# Patient Record
Sex: Male | Born: 1950
Health system: Southern US, Community
[De-identification: ages and names within clinical notes are randomized; demographics above are authoritative.]

## PROBLEM LIST (undated history)

## (undated) DIAGNOSIS — M51369 Other intervertebral disc degeneration, lumbar region without mention of lumbar back pain or lower extremity pain: Secondary | ICD-10-CM

## (undated) DIAGNOSIS — N529 Male erectile dysfunction, unspecified: Secondary | ICD-10-CM

## (undated) DIAGNOSIS — E785 Hyperlipidemia, unspecified: Secondary | ICD-10-CM

## (undated) DIAGNOSIS — I10 Essential (primary) hypertension: Secondary | ICD-10-CM

## (undated) DIAGNOSIS — M5136 Other intervertebral disc degeneration, lumbar region: Secondary | ICD-10-CM

## (undated) DIAGNOSIS — M5126 Other intervertebral disc displacement, lumbar region: Secondary | ICD-10-CM

## (undated) DIAGNOSIS — E291 Testicular hypofunction: Secondary | ICD-10-CM

## (undated) DIAGNOSIS — K429 Umbilical hernia without obstruction or gangrene: Secondary | ICD-10-CM

## (undated) DIAGNOSIS — E119 Type 2 diabetes mellitus without complications: Secondary | ICD-10-CM

## (undated) HISTORY — PX: APPENDECTOMY: SHX54

---

## 2008-01-18 ENCOUNTER — Emergency Department (HOSPITAL_COMMUNITY): Admission: EM | Admit: 2008-01-18 | Discharge: 2008-01-18 | Payer: Self-pay | Admitting: Emergency Medicine

## 2011-05-12 LAB — MONONUCLEOSIS SCREEN: Mono Screen: NEGATIVE

## 2011-05-12 LAB — POCT I-STAT, CHEM 8
BUN: 14
Creatinine, Ser: 1.1
Glucose, Bld: 144 — ABNORMAL HIGH
Hemoglobin: 16.3
TCO2: 23

## 2011-05-12 LAB — CBC
HCT: 47.1
Hemoglobin: 16.3
MCHC: 34.7
MCV: 87.7
RBC: 5.37

## 2011-05-12 LAB — DIFFERENTIAL
Basophils Absolute: 0
Basophils Relative: 0
Eosinophils Absolute: 0
Eosinophils Relative: 0
Lymphocytes Relative: 5 — ABNORMAL LOW
Lymphs Abs: 0.3 — ABNORMAL LOW
Monocytes Absolute: 0.5
Monocytes Relative: 10
Neutro Abs: 4.3
Neutrophils Relative %: 85 — ABNORMAL HIGH

## 2011-05-12 LAB — INFLUENZA A+B VIRUS AG-DIRECT(RAPID)
Inflenza A Ag: NEGATIVE
Influenza B Ag: NEGATIVE

## 2011-05-12 LAB — URINALYSIS, ROUTINE W REFLEX MICROSCOPIC
Bilirubin Urine: NEGATIVE
Glucose, UA: 500 — AB
Hgb urine dipstick: NEGATIVE
Nitrite: NEGATIVE
Protein, ur: NEGATIVE
Specific Gravity, Urine: 1.04 — ABNORMAL HIGH
Urobilinogen, UA: 1
pH: 5.5

## 2016-08-19 DIAGNOSIS — E559 Vitamin D deficiency, unspecified: Secondary | ICD-10-CM | POA: Diagnosis not present

## 2016-08-19 DIAGNOSIS — Z125 Encounter for screening for malignant neoplasm of prostate: Secondary | ICD-10-CM | POA: Diagnosis not present

## 2016-08-19 DIAGNOSIS — E1122 Type 2 diabetes mellitus with diabetic chronic kidney disease: Secondary | ICD-10-CM | POA: Diagnosis not present

## 2016-08-19 DIAGNOSIS — Z0001 Encounter for general adult medical examination with abnormal findings: Secondary | ICD-10-CM | POA: Diagnosis not present

## 2016-08-19 DIAGNOSIS — I129 Hypertensive chronic kidney disease with stage 1 through stage 4 chronic kidney disease, or unspecified chronic kidney disease: Secondary | ICD-10-CM | POA: Diagnosis not present

## 2016-08-29 DIAGNOSIS — Z23 Encounter for immunization: Secondary | ICD-10-CM | POA: Diagnosis not present

## 2016-08-29 DIAGNOSIS — Z0001 Encounter for general adult medical examination with abnormal findings: Secondary | ICD-10-CM | POA: Diagnosis not present

## 2016-12-22 DIAGNOSIS — E119 Type 2 diabetes mellitus without complications: Secondary | ICD-10-CM | POA: Diagnosis not present

## 2016-12-22 DIAGNOSIS — R05 Cough: Secondary | ICD-10-CM | POA: Diagnosis not present

## 2016-12-22 DIAGNOSIS — J309 Allergic rhinitis, unspecified: Secondary | ICD-10-CM | POA: Diagnosis not present

## 2017-02-21 DIAGNOSIS — I129 Hypertensive chronic kidney disease with stage 1 through stage 4 chronic kidney disease, or unspecified chronic kidney disease: Secondary | ICD-10-CM | POA: Diagnosis not present

## 2017-02-21 DIAGNOSIS — N182 Chronic kidney disease, stage 2 (mild): Secondary | ICD-10-CM | POA: Diagnosis not present

## 2017-02-21 DIAGNOSIS — E559 Vitamin D deficiency, unspecified: Secondary | ICD-10-CM | POA: Diagnosis not present

## 2017-02-21 DIAGNOSIS — E785 Hyperlipidemia, unspecified: Secondary | ICD-10-CM | POA: Diagnosis not present

## 2017-02-21 DIAGNOSIS — E1121 Type 2 diabetes mellitus with diabetic nephropathy: Secondary | ICD-10-CM | POA: Diagnosis not present

## 2017-02-27 DIAGNOSIS — E785 Hyperlipidemia, unspecified: Secondary | ICD-10-CM | POA: Diagnosis not present

## 2017-02-27 DIAGNOSIS — E1122 Type 2 diabetes mellitus with diabetic chronic kidney disease: Secondary | ICD-10-CM | POA: Diagnosis not present

## 2017-02-27 DIAGNOSIS — I129 Hypertensive chronic kidney disease with stage 1 through stage 4 chronic kidney disease, or unspecified chronic kidney disease: Secondary | ICD-10-CM | POA: Diagnosis not present

## 2017-02-27 DIAGNOSIS — E559 Vitamin D deficiency, unspecified: Secondary | ICD-10-CM | POA: Diagnosis not present

## 2017-02-27 DIAGNOSIS — E875 Hyperkalemia: Secondary | ICD-10-CM | POA: Diagnosis not present

## 2017-03-07 DIAGNOSIS — E119 Type 2 diabetes mellitus without complications: Secondary | ICD-10-CM | POA: Diagnosis not present

## 2017-03-07 DIAGNOSIS — H6123 Impacted cerumen, bilateral: Secondary | ICD-10-CM | POA: Diagnosis not present

## 2017-05-31 DIAGNOSIS — Z23 Encounter for immunization: Secondary | ICD-10-CM | POA: Diagnosis not present

## 2017-05-31 DIAGNOSIS — E1122 Type 2 diabetes mellitus with diabetic chronic kidney disease: Secondary | ICD-10-CM | POA: Diagnosis not present

## 2017-08-25 DIAGNOSIS — I10 Essential (primary) hypertension: Secondary | ICD-10-CM | POA: Diagnosis not present

## 2017-08-25 DIAGNOSIS — E559 Vitamin D deficiency, unspecified: Secondary | ICD-10-CM | POA: Diagnosis not present

## 2017-08-25 DIAGNOSIS — Z125 Encounter for screening for malignant neoplasm of prostate: Secondary | ICD-10-CM | POA: Diagnosis not present

## 2017-08-25 DIAGNOSIS — E1122 Type 2 diabetes mellitus with diabetic chronic kidney disease: Secondary | ICD-10-CM | POA: Diagnosis not present

## 2017-08-25 DIAGNOSIS — Z0000 Encounter for general adult medical examination without abnormal findings: Secondary | ICD-10-CM | POA: Diagnosis not present

## 2017-08-25 DIAGNOSIS — E785 Hyperlipidemia, unspecified: Secondary | ICD-10-CM | POA: Diagnosis not present

## 2017-08-31 DIAGNOSIS — E785 Hyperlipidemia, unspecified: Secondary | ICD-10-CM | POA: Diagnosis not present

## 2017-08-31 DIAGNOSIS — I129 Hypertensive chronic kidney disease with stage 1 through stage 4 chronic kidney disease, or unspecified chronic kidney disease: Secondary | ICD-10-CM | POA: Diagnosis not present

## 2017-08-31 DIAGNOSIS — E1122 Type 2 diabetes mellitus with diabetic chronic kidney disease: Secondary | ICD-10-CM | POA: Diagnosis not present

## 2017-08-31 DIAGNOSIS — Z0001 Encounter for general adult medical examination with abnormal findings: Secondary | ICD-10-CM | POA: Diagnosis not present

## 2017-08-31 DIAGNOSIS — E559 Vitamin D deficiency, unspecified: Secondary | ICD-10-CM | POA: Diagnosis not present

## 2017-08-31 DIAGNOSIS — N529 Male erectile dysfunction, unspecified: Secondary | ICD-10-CM | POA: Diagnosis not present

## 2017-09-07 DIAGNOSIS — R7303 Prediabetes: Secondary | ICD-10-CM | POA: Diagnosis not present

## 2017-09-07 DIAGNOSIS — H2513 Age-related nuclear cataract, bilateral: Secondary | ICD-10-CM | POA: Diagnosis not present

## 2018-02-17 DIAGNOSIS — B029 Zoster without complications: Secondary | ICD-10-CM | POA: Diagnosis not present

## 2018-02-28 DIAGNOSIS — E785 Hyperlipidemia, unspecified: Secondary | ICD-10-CM | POA: Diagnosis not present

## 2018-02-28 DIAGNOSIS — E1122 Type 2 diabetes mellitus with diabetic chronic kidney disease: Secondary | ICD-10-CM | POA: Diagnosis not present

## 2018-02-28 DIAGNOSIS — N182 Chronic kidney disease, stage 2 (mild): Secondary | ICD-10-CM | POA: Diagnosis not present

## 2018-02-28 DIAGNOSIS — I129 Hypertensive chronic kidney disease with stage 1 through stage 4 chronic kidney disease, or unspecified chronic kidney disease: Secondary | ICD-10-CM | POA: Diagnosis not present

## 2018-06-01 DIAGNOSIS — Z23 Encounter for immunization: Secondary | ICD-10-CM | POA: Diagnosis not present

## 2018-07-23 DIAGNOSIS — E1122 Type 2 diabetes mellitus with diabetic chronic kidney disease: Secondary | ICD-10-CM | POA: Diagnosis not present

## 2018-07-23 DIAGNOSIS — E785 Hyperlipidemia, unspecified: Secondary | ICD-10-CM | POA: Diagnosis not present

## 2018-07-23 DIAGNOSIS — N182 Chronic kidney disease, stage 2 (mild): Secondary | ICD-10-CM | POA: Diagnosis not present

## 2018-07-23 DIAGNOSIS — I129 Hypertensive chronic kidney disease with stage 1 through stage 4 chronic kidney disease, or unspecified chronic kidney disease: Secondary | ICD-10-CM | POA: Diagnosis not present

## 2018-07-27 DIAGNOSIS — E1122 Type 2 diabetes mellitus with diabetic chronic kidney disease: Secondary | ICD-10-CM | POA: Diagnosis not present

## 2018-07-27 DIAGNOSIS — I129 Hypertensive chronic kidney disease with stage 1 through stage 4 chronic kidney disease, or unspecified chronic kidney disease: Secondary | ICD-10-CM | POA: Diagnosis not present

## 2018-07-27 DIAGNOSIS — E1121 Type 2 diabetes mellitus with diabetic nephropathy: Secondary | ICD-10-CM | POA: Diagnosis not present

## 2018-07-27 DIAGNOSIS — Z23 Encounter for immunization: Secondary | ICD-10-CM | POA: Diagnosis not present

## 2018-07-27 DIAGNOSIS — E785 Hyperlipidemia, unspecified: Secondary | ICD-10-CM | POA: Diagnosis not present

## 2018-07-27 DIAGNOSIS — N182 Chronic kidney disease, stage 2 (mild): Secondary | ICD-10-CM | POA: Diagnosis not present

## 2019-02-01 ENCOUNTER — Ambulatory Visit: Payer: Self-pay

## 2019-02-01 ENCOUNTER — Other Ambulatory Visit: Payer: Self-pay

## 2019-02-01 ENCOUNTER — Ambulatory Visit (INDEPENDENT_AMBULATORY_CARE_PROVIDER_SITE_OTHER): Payer: 59 | Admitting: Surgery

## 2019-02-01 ENCOUNTER — Encounter: Payer: Self-pay | Admitting: Surgery

## 2019-02-01 DIAGNOSIS — M5416 Radiculopathy, lumbar region: Secondary | ICD-10-CM | POA: Diagnosis not present

## 2019-02-01 DIAGNOSIS — M545 Low back pain, unspecified: Secondary | ICD-10-CM

## 2019-02-01 DIAGNOSIS — R29898 Other symptoms and signs involving the musculoskeletal system: Secondary | ICD-10-CM

## 2019-02-01 DIAGNOSIS — M5136 Other intervertebral disc degeneration, lumbar region: Secondary | ICD-10-CM

## 2019-02-01 MED ORDER — TRAMADOL HCL 50 MG PO TABS: 50.0000 mg | ORAL_TABLET | Freq: Four times a day (QID) | ORAL | 0 refills | Status: DC | PRN

## 2019-02-01 MED ORDER — METHOCARBAMOL 750 MG PO TABS: 750.0000 mg | ORAL_TABLET | Freq: Three times a day (TID) | ORAL | 0 refills | Status: DC | PRN

## 2019-02-01 MED ORDER — KETOROLAC TROMETHAMINE 30 MG/ML IJ SOLN: 60.0000 mg | Freq: Once | INTRAMUSCULAR | Status: AC

## 2019-02-01 NOTE — Progress Notes (Signed)
Office Visit Note   Patient: Aaron Duarte           Date of Birth: [birth year]           MRN: [id number] Visit Date: 02/01/2019              Requested by: No referring provider defined for this encounter. PCP: Patient, No Pcp Per   Assessment & Plan: Visit Diagnoses:  1. Low back pain, unspecified back pain laterality, unspecified chronicity, unspecified whether sciatica present   2. Other intervertebral disc degeneration, lumbar region   3. Radiculopathy, lumbar region   4. Left leg weakness     Plan: With patient's acute symptoms along with trace left anterior tib and EHL weakness on exam I think that we need to get a lumbar MRI to rule out acute HNP.  We will see if I can get a stat scan this weekend.  Today in the clinic he was given a Toradol 60 mg IM injection to see if this will help with his pain.  Patient does have history of diabetes so I do not feel comfortable giving him a prednisone taper due to the potential for this to elevate his blood sugars.  Prescriptions also given for Ultram for pain and Robaxin for spasms.  I will have him follow-up with Dr. Louanne Skye after completion of his study to discuss results and further treatment options.   Follow-Up Instructions: Return in about 2 weeks (around 02/15/2019) for with Dr Louanne Skye to review MRI lumbar.   Orders:  Orders Placed This Encounter  Procedures  . XR Lumbar Spine 2-3 Views  . MR Lumbar Spine w/o contrast   Meds ordered this encounter  Medications  . traMADol (ULTRAM) 50 MG tablet    Sig: Take 1 tablet (50 mg total) by mouth every 6 (six) hours as needed.    Dispense:  30 tablet    Refill:  0  . methocarbamol (ROBAXIN) 750 MG tablet    Sig: Take 1 tablet (750 mg total) by mouth every 8 (eight) hours as needed for muscle spasms.    Dispense:  60 tablet    Refill:  0      Procedures: No procedures performed   Clinical Data: No additional findings.   Subjective: Chief Complaint  Patient presents with  .  Lower Back - Pain    HPI 68 year old white male who is new patient the office comes in today with complaints of acute low back pain, left lower extremity radiculopathy and left leg weakness that started yesterday.  Patient dates that yesterday he was pressure washing his deck and felt some soreness in his low back.  Later he bent over while seated to the left side to undo his boot and he felt a sharp pain that shot down the left buttock down his left lower leg.  No symptoms in the right side.  He complains of constant left-sided leg pain with numbness and tingling down towards his foot.  Increased pain with ambulation, bending, twisting.  Patient states that he has not had any problems with his back within the last several years.  He is usually very active with exercising and doing other strenuous activities around the house.  No complaints of bowel or bladder incontinence.  He states that he has never had any problems of this nature. Review of Systems No current cardiac pulmonary GI GU issues  Objective: Vital Signs: There were no vitals taken for this visit.  Physical Exam HENT:  Head: Normocephalic and atraumatic.  Eyes:     Extraocular Movements: Extraocular movements intact.     Pupils: Pupils are equal, round, and reactive to light.  Pulmonary:     Effort: No respiratory distress.  Musculoskeletal:     Comments: Gait considerably antalgic.  Positive left lumbar paraspinal tenderness.  Marked left-sided notch tenderness.  Negative logroll.  Positive left straight leg raise.  Negative on the right side.  Bilateral calves are nontender.  Patient does have trace left EHL and anterior tib weakness.  Gastroc intact.  No weakness right lower extremity.  Skin:    General: Skin is warm and dry.  Neurological:     Mental Status: He is oriented to person, place, and time.  Psychiatric:        Mood and Affect: Mood normal.     Ortho Exam  Specialty Comments:  No specialty comments  available.  Imaging: No results found.   PMFS History: There are no active problems to display for this patient.  History reviewed. No pertinent past medical history.  History reviewed. No pertinent family history.  History reviewed. No pertinent surgical history. Social History   Occupational History  . Not on file  Tobacco Use  . Smoking status: Not on file  Substance and Sexual Activity  . Alcohol use: Not on file  . Drug use: Not on file  . Sexual activity: Not on file

## 2019-02-02 ENCOUNTER — Ambulatory Visit (HOSPITAL_COMMUNITY): Payer: 59

## 2019-02-05 ENCOUNTER — Ambulatory Visit (INDEPENDENT_AMBULATORY_CARE_PROVIDER_SITE_OTHER): Payer: 59 | Admitting: Orthopaedic Surgery

## 2019-02-05 ENCOUNTER — Encounter: Payer: Self-pay | Admitting: Orthopaedic Surgery

## 2019-02-05 ENCOUNTER — Other Ambulatory Visit: Payer: Self-pay

## 2019-02-05 VITALS — Ht 69.5 in | Wt 206.0 lb

## 2019-02-05 DIAGNOSIS — M5416 Radiculopathy, lumbar region: Secondary | ICD-10-CM | POA: Diagnosis not present

## 2019-02-05 MED ORDER — TRAMADOL HCL 50 MG PO TABS: 50.0000 mg | ORAL_TABLET | Freq: Four times a day (QID) | ORAL | 0 refills | Status: DC | PRN

## 2019-02-05 MED ORDER — DICLOFENAC SODIUM 75 MG PO TBEC: 75.0000 mg | DELAYED_RELEASE_TABLET | Freq: Two times a day (BID) | ORAL | 0 refills | Status: DC

## 2019-02-05 NOTE — Progress Notes (Signed)
Office Visit Note   Patient: Aaron Duarte           Date of Birth: 11-10-1950           MRN: [id number] Visit Date: 02/05/2019              Requested by: Merrilee Seashore, [address] [place],  [place] [address] PCP: Merrilee Seashore, MD   Assessment & Plan: Visit Diagnoses:  1. Radiculopathy, lumbar region     Plan: Patient will continue taking Ultram, and Robaxin.  He will discontinue Advil and I gave prescription for Voltaren 75 mg 1 tab p.o. twice daily as needed to take with food.  He can also use Tylenol in between.  We will reschedule lumbar MRI and patient will follow-up with Dr. Louanne Skye to discuss results.  All questions answered.  Follow-Up Instructions: Return in about 15 days (around 02/20/2019) for with Dr Louanne Skye to review lumbar MRI.   Orders:  No orders of the defined types were placed in this encounter.  Meds ordered this encounter  Medications   traMADol (ULTRAM) 50 MG tablet    Sig: Take 1 tablet (50 mg total) by mouth every 6 (six) hours as needed.    Dispense:  30 tablet    Refill:  0   diclofenac (VOLTAREN) 75 MG EC tablet    Sig: Take 1 tablet (75 mg total) by mouth 2 (two) times daily.    Dispense:  60 tablet    Refill:  0      Procedures: No procedures performed   Clinical Data: No additional findings.   Subjective: Chief Complaint  Patient presents with   Lower Back - Follow-up    HPI 68 year old white male returns for recheck of his low back pain and left lower extremity radiculopathy.  Patient was seen by me last week for same complaint but was also having anterior tib and EHL weakness on the left.  I had ordered an MRI scan for the weekend but insurance denied.  I asked patient to come in this morning for repeat exam.  States that his pain has somewhat improved.  Continues to have ongoing sharp pain in the left buttock with aching down into the lateral left calf.  The more he moves around the worse his symptoms are.   No symptoms on the right side.  He has been using the Ultram, Robaxin and Advil as needed. Review of Systems No current cardiac pulmonary GI GU issues     Objective: Vital Signs: Ht 5' 9.5" (1.765 m)    Wt 206 lb (93.4 kg)    BMI 29.98 kg/m   Physical Exam Constitutional:      Appearance: Normal appearance.  HENT:     Head: Normocephalic.  Eyes:     Extraocular Movements: Extraocular movements intact.     Pupils: Pupils are equal, round, and reactive to light.  Pulmonary:     Effort: No respiratory distress.  Musculoskeletal:     Comments: Gait is antalgic.  Lumbar paraspinal tenderness.  Moderate to marked left sciatic notch tenderness.  Negative on the right side.  Positive left straight leg raise with popliteal compression.  Bilateral calves nontender.  Today he does not have left anterior tib or EHL weakness.  Strength maneuvers within normal limits.  Neurological:     Mental Status: He is alert.     Ortho Exam  Specialty Comments:  No specialty comments available.  Imaging: No results found.   [place]  History: There are no active problems to display for this patient.  No past medical history on file.  No family history on file.  No past surgical history on file. Social History   Occupational History   Not on file  Tobacco Use   Smoking status: Not on file  Substance and Sexual Activity   Alcohol use: Not on file   Drug use: Not on file   Sexual activity: Not on file

## 2019-02-07 ENCOUNTER — Other Ambulatory Visit: Payer: Self-pay | Admitting: Surgery

## 2019-02-07 DIAGNOSIS — Z77018 Contact with and (suspected) exposure to other hazardous metals: Secondary | ICD-10-CM

## 2019-02-08 ENCOUNTER — Ambulatory Visit (HOSPITAL_COMMUNITY): Payer: 59

## 2019-02-13 ENCOUNTER — Ambulatory Visit: Payer: Self-pay

## 2019-02-13 ENCOUNTER — Telehealth: Payer: Self-pay | Admitting: Radiology

## 2019-02-13 ENCOUNTER — Ambulatory Visit (INDEPENDENT_AMBULATORY_CARE_PROVIDER_SITE_OTHER): Payer: 59 | Admitting: Surgery

## 2019-02-13 ENCOUNTER — Telehealth: Payer: Self-pay | Admitting: Surgery

## 2019-02-13 ENCOUNTER — Ambulatory Visit (HOSPITAL_COMMUNITY)
Admission: RE | Admit: 2019-02-13 | Discharge: 2019-02-13 | Disposition: A | Discharge status: Home / Self Care | Payer: 59 | Source: Ambulatory Visit | Attending: Surgery | Admitting: Surgery

## 2019-02-13 ENCOUNTER — Other Ambulatory Visit: Payer: Self-pay

## 2019-02-13 ENCOUNTER — Encounter: Payer: Self-pay | Admitting: Surgery

## 2019-02-13 VITALS — Ht 69.5 in | Wt 206.0 lb

## 2019-02-13 DIAGNOSIS — M5416 Radiculopathy, lumbar region: Secondary | ICD-10-CM

## 2019-02-13 DIAGNOSIS — M79662 Pain in left lower leg: Secondary | ICD-10-CM

## 2019-02-13 DIAGNOSIS — M25562 Pain in left knee: Secondary | ICD-10-CM

## 2019-02-13 NOTE — Telephone Encounter (Signed)
Aaron Duarte from [facility] Call report Negative DVT Left leg

## 2019-02-13 NOTE — Telephone Encounter (Signed)
Aaron Duarte called patient and gave venous doppler report.  Please order ncv/emg left LE r/o lumbar radiculopathy vs peroneal entrapment neuropathy

## 2019-02-13 NOTE — Progress Notes (Signed)
Office Visit Note   Patient: Aaron Duarte           Date of Birth: 06/16/1951           MRN: [id number] Visit Date: 02/13/2019              Requested by: Merrilee Seashore, [address] [place],  [place] [address] PCP: Merrilee Seashore, MD   Assessment & Plan: Visit Diagnoses:  1. Pain of left calf   2. Left knee pain, unspecified chronicity   3.     question left peroneal neuropathy  Plan: With patient's left calf pain that is different from previous visit I will send him for venous Doppler left lower extremity to rule out DVT.  I will await callback report from the vascular lab.  He is scheduled for lumbar MRI July 18.  I may consider getting an NCV/EMG study left lower extremity to rule out possible peroneal neuropathy at the level of the fibular head.  We will see what the venous Doppler report shows.  Follow-Up Instructions: Return in about 2 weeks (around 02/27/2019).   Orders:  Orders Placed This Encounter  Procedures  . XR Knee 1-2 Views Left   No orders of the defined types were placed in this encounter.     Procedures: No procedures performed   Clinical Data: No additional findings.   Subjective: Chief Complaint  Patient presents with  . Lower Back - Follow-up    HPI 68 year old white male returns for recheck.  States that most of his pain and cramping currently is in the posterior and lateral calf and also the left shin.  Gait is still antalgic.  Today not complain of any back pain or pain into the left buttock.  He has mild discomfort over the left hip greater trochanter bursa.  Pain when he lays on his left side.  Times when he is up too long he still does get radicular pain down the leg.  No complaints of chest pain shortness of breath.  He has been very inactive over the last couple of weeks due to ongoing issues. Review of Systems No current cardiac pulmonary GI GU issues  Objective: Vital Signs: Ht 5' 9.5" (1.765 m)   Wt 206 lb  (93.4 kg)   BMI 29.98 kg/m   Physical Exam Constitutional:      Appearance: Normal appearance.  HENT:     Head: Normocephalic.  Eyes:     Extraocular Movements: Extraocular movements intact.     Pupils: Pupils are equal, round, and reactive to light.  Pulmonary:     Effort: Pulmonary effort is normal. No respiratory distress.  Musculoskeletal:     Comments: Gait is still fairly antalgic with attempted weightbearing on left lower extremity.  Sciatic notch nontender.  His mild to moderate tender over the left hip greater trochanter bursa.  Negative logroll.  Left knee good range of motion.  Ligaments are stable.  Joint line nontender.  He is markedly tender over the peroneal nerve at the level of the fibular head.  Palpation does cause pain that shoots down his lateral calf.  He also has moderate to marked left calf tenderness.  Left greater than right pitting edema.  No focal motor deficits.  Neurological:     General: No focal deficit present.     Mental Status: He is alert and oriented to person, place, and time.  Psychiatric:        Mood and Affect: Mood normal.  Ortho Exam  Specialty Comments:  No specialty comments available.  Imaging: No results found.   PMFS History: There are no active problems to display for this patient.  History reviewed. No pertinent past medical history.  History reviewed. No pertinent family history.  History reviewed. No pertinent surgical history. Social History   Occupational History  . Not on file  Tobacco Use  . Smoking status: Not on file  Substance and Sexual Activity  . Alcohol use: Not on file  . Drug use: Not on file  . Sexual activity: Not on file

## 2019-02-13 NOTE — Telephone Encounter (Signed)
Pt came had an appt this morning with owens and at check out he requested that owens give him a call before 5 to address some concerns they discussed during his appt.   [phone]

## 2019-02-13 NOTE — Telephone Encounter (Signed)
Please see below. Patient requests return call from you in regards to things you discussed in his office visit.

## 2019-02-14 ENCOUNTER — Encounter: Payer: Self-pay | Admitting: Neurology

## 2019-02-14 ENCOUNTER — Telehealth: Payer: Self-pay | Admitting: Surgery

## 2019-02-14 ENCOUNTER — Ambulatory Visit: Payer: 59 | Admitting: Surgery

## 2019-02-14 ENCOUNTER — Other Ambulatory Visit: Payer: Self-pay

## 2019-02-14 DIAGNOSIS — M25562 Pain in left knee: Secondary | ICD-10-CM

## 2019-02-14 DIAGNOSIS — M5416 Radiculopathy, lumbar region: Secondary | ICD-10-CM

## 2019-02-14 MED ORDER — TRAMADOL HCL 50 MG PO TABS: 50.0000 mg | ORAL_TABLET | Freq: Three times a day (TID) | ORAL | 0 refills | Status: DC | PRN

## 2019-02-14 NOTE — Telephone Encounter (Signed)
Referral entered  

## 2019-02-14 NOTE — Telephone Encounter (Signed)
Patient called stated patient seen by Jeneen Rinks yesterday and for got to inquire about Tramadol refill. Will be out on Saturday. Per Lauren to send to you. Patient has never seen any one but Jeneen Rinks.  [phone] Kenny[place] Lose

## 2019-02-14 NOTE — Telephone Encounter (Signed)
[place] for [employer] 50mg  po q8hrs prn pain  #50

## 2019-02-14 NOTE — Telephone Encounter (Signed)
rx called to pharmacy. I called number left for patient's wife, no answer and voicemail is full. Called [phone] and left voicemail for patient advising.

## 2019-02-14 NOTE — Telephone Encounter (Signed)
Please advise 

## 2019-02-25 ENCOUNTER — Other Ambulatory Visit: Payer: Self-pay | Admitting: Surgery

## 2019-02-25 NOTE — Telephone Encounter (Signed)
Diclofenac refill request 

## 2019-02-26 NOTE — Telephone Encounter (Signed)
Diclofenac refill request 

## 2019-02-27 ENCOUNTER — Ambulatory Visit (INDEPENDENT_AMBULATORY_CARE_PROVIDER_SITE_OTHER): Payer: 59 | Admitting: Surgery

## 2019-02-27 ENCOUNTER — Encounter: Payer: Self-pay | Admitting: Surgery

## 2019-02-27 ENCOUNTER — Other Ambulatory Visit: Payer: Self-pay

## 2019-02-27 VITALS — Ht 69.5 in | Wt 206.0 lb

## 2019-02-27 DIAGNOSIS — M5416 Radiculopathy, lumbar region: Secondary | ICD-10-CM

## 2019-02-27 DIAGNOSIS — M79662 Pain in left lower leg: Secondary | ICD-10-CM | POA: Diagnosis not present

## 2019-02-27 MED ORDER — METHOCARBAMOL 500 MG PO TABS: 500.0000 mg | ORAL_TABLET | Freq: Three times a day (TID) | ORAL | 0 refills | Status: DC | PRN

## 2019-02-27 NOTE — Progress Notes (Signed)
Office Visit Note   Patient: Aaron Duarte           Date of Birth: [birth year]           MRN: [id number] Visit Date: 02/27/2019              Requested by: Merrilee Seashore, [address] [place] [address] [place],  [place] [address] PCP: Merrilee Seashore, MD   Assessment & Plan: Visit Diagnoses:  1. Radiculopathy, lumbar region   2. Pain of left calf     Plan: We will proceed with getting lumbar MRI to rule out HNP/stenosis since patient continues to have ongoing low back pain and left lower extremity radicular symptoms.  He has had some improvement from his initial presentation here in the clinic.  He will follow-up with Dr. Louanne Skye after completion of his study to discuss results and further treatment options.  Dr. Louanne Skye will decide at that time if we need to proceed with the left lower extremity NCV/EMG study that is scheduled to be done in a few weeks.  Follow-Up Instructions: Return for as scheduled with Dr Louanne Skye to review lumbar MRI.   Orders:  No orders of the defined types were placed in this encounter.  Meds ordered this encounter  Medications   methocarbamol (ROBAXIN) 500 MG tablet    Sig: Take 1 tablet (500 mg total) by mouth every 8 (eight) hours as needed for muscle spasms.    Dispense:  40 tablet    Refill:  0      Procedures: No procedures performed   Clinical Data: No additional findings.   Subjective: Chief Complaint  Patient presents with   Lower Back - Follow-up   Left Leg - Follow-up    HPI 68 year old white male returns for recheck of his low back pain and left lower extremity radicular symptoms.  He states that he has had some improvement of his back pain compared to his initial presentation.  When he is up and ambulating he still continues to get low back pain that radiates into the left buttock and also down to the left lateral calf.  He is scheduled for left lower extremity NCV/EMG study March 19, 2019.  Also scheduled for lumbar MRI July  18.  No radicular symptoms on the right side.  He has discontinue the tramadol but continues to take Tylenol, Robaxin and Voltaren. Review of Systems No current cardiac pulmonary GI GU issues  Objective: Vital Signs: Ht 5' 9.5" (1.765 m)    Wt 206 lb (93.4 kg)    BMI 29.98 kg/m   Physical Exam HENT:     Head: Normocephalic.  Eyes:     Extraocular Movements: Extraocular movements intact.     Pupils: Pupils are equal, round, and reactive to light.  Pulmonary:     Effort: No respiratory distress.  Musculoskeletal:     Comments: Gait is somewhat antalgic.  Some discomfort with lumbar extension.  Left lumbar paraspinal tenderness.  Negative logroll bilateral hips.  Mild discomfort with left straight leg raise.  Bilateral calves nontender.  No focal motor deficits.  Neurological:     General: No focal deficit present.     Mental Status: He is alert and oriented to person, place, and time.  Psychiatric:        Mood and Affect: Mood normal.     Ortho Exam  Specialty Comments:  No specialty comments available.  Imaging: No results found.   PMFS History: There are no active problems to  display for this patient.  History reviewed. No pertinent past medical history.  History reviewed. No pertinent family history.  History reviewed. No pertinent surgical history. Social History   Occupational History   Not on file  Tobacco Use   Smoking status: Not on file  Substance and Sexual Activity   Alcohol use: Not on file   Drug use: Not on file   Sexual activity: Not on file

## 2019-03-01 ENCOUNTER — Ambulatory Visit
Admission: RE | Admit: 2019-03-01 | Discharge: 2019-03-01 | Disposition: A | Discharge status: Home / Self Care | Payer: 59 | Source: Ambulatory Visit | Attending: Surgery | Admitting: Surgery

## 2019-03-01 DIAGNOSIS — Z135 Encounter for screening for eye and ear disorders: Secondary | ICD-10-CM | POA: Diagnosis not present

## 2019-03-01 DIAGNOSIS — Z77018 Contact with and (suspected) exposure to other hazardous metals: Secondary | ICD-10-CM

## 2019-03-02 ENCOUNTER — Ambulatory Visit
Admission: RE | Admit: 2019-03-02 | Discharge: 2019-03-02 | Disposition: A | Discharge status: Home / Self Care | Payer: 59 | Source: Ambulatory Visit | Attending: Surgery | Admitting: Surgery

## 2019-03-02 ENCOUNTER — Other Ambulatory Visit: Payer: Self-pay

## 2019-03-02 DIAGNOSIS — M5136 Other intervertebral disc degeneration, lumbar region: Secondary | ICD-10-CM

## 2019-03-07 ENCOUNTER — Other Ambulatory Visit: Payer: Self-pay

## 2019-03-07 ENCOUNTER — Ambulatory Visit (INDEPENDENT_AMBULATORY_CARE_PROVIDER_SITE_OTHER): Payer: 59 | Admitting: Specialist

## 2019-03-07 ENCOUNTER — Encounter: Payer: Self-pay | Admitting: Specialist

## 2019-03-07 VITALS — BP 211/92 | HR 83 | Ht 69.5 in | Wt 206.0 lb

## 2019-03-07 DIAGNOSIS — M5126 Other intervertebral disc displacement, lumbar region: Secondary | ICD-10-CM | POA: Diagnosis not present

## 2019-03-07 DIAGNOSIS — M5416 Radiculopathy, lumbar region: Secondary | ICD-10-CM | POA: Diagnosis not present

## 2019-03-07 MED ORDER — TRAMADOL HCL 50 MG PO TABS: 50.0000 mg | ORAL_TABLET | Freq: Three times a day (TID) | ORAL | 0 refills | Status: DC | PRN

## 2019-03-07 NOTE — Progress Notes (Signed)
Office Visit Note   Patient: Aaron Duarte           Date of Birth: Dec 11, 1950           MRN: [id number] Visit Date: 03/07/2019              Requested by: Merrilee Seashore, [address] [place],  [place] [address] PCP: Merrilee Seashore, MD   Assessment & Plan: Visit Diagnoses:  1. Radiculopathy, lumbar region   2. 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 May use ice or moist heat for pain. Weight loss is of benefit. Discontinue the diclofenac  And see your primary care MD to be sure that your blood pressure returns to normal. Exercise is important to improve your indurance and does allow people to function better inspite of back pain. Tramadol for pain.    Follow-Up Instructions: No follow-ups on file.   Orders:  No orders of the defined types were placed in this encounter.  No orders of the defined types were placed in this encounter.     Procedures: No procedures performed   Clinical Data: No additional findings.   Subjective: Chief Complaint  Patient presents with  . Lower Back - Follow-up    MRI Review    68 year old male with history of left thigh anterior pain with pain into the left anterior knee and anterior left shin. He relates the pain is better. Strength is improving.    Review of Systems  Constitutional: Positive for activity change. Negative for appetite change, chills, diaphoresis, fatigue, fever and unexpected weight change.  HENT: Negative.  Negative for congestion, dental problem, drooling, ear discharge, ear pain, facial swelling, hearing loss, mouth sores, nosebleeds, postnasal drip, rhinorrhea, sinus pressure, sinus pain, sneezing, sore throat, tinnitus, trouble swallowing and voice change.   Eyes: Negative.  Negative for photophobia, pain, discharge, redness, itching and visual disturbance.  Respiratory:  Negative.  Negative for apnea, cough, choking, chest tightness, shortness of breath, wheezing and stridor.   Cardiovascular: Negative for chest pain, palpitations and leg swelling.  Gastrointestinal: Negative.  Negative for abdominal distention, abdominal pain, anal bleeding, blood in stool, constipation, diarrhea, nausea, rectal pain and vomiting.  Endocrine: Negative.  Negative for cold intolerance, heat intolerance, polydipsia, polyphagia and polyuria.  Genitourinary: Negative.  Negative for difficulty urinating, dysuria, enuresis, flank pain, frequency, genital sores, hematuria and urgency.  Musculoskeletal: Positive for back pain. Negative for arthralgias, gait problem, joint swelling, myalgias, neck pain and neck stiffness.  Allergic/Immunologic: Negative for environmental allergies, food allergies and immunocompromised state.  Neurological: Negative.  Negative for dizziness, tremors, seizures, syncope, facial asymmetry, speech difficulty, weakness, light-headedness, numbness and headaches.  Hematological: Negative.  Negative for adenopathy. Does not bruise/bleed easily.  Psychiatric/Behavioral: Negative.  Negative for agitation, behavioral problems, confusion, decreased concentration, dysphoric mood, hallucinations, self-injury, sleep disturbance and suicidal ideas. The patient is not nervous/anxious and is not hyperactive.      Objective: Vital Signs: BP (!) 211/92 (BP Location: Left Arm, Patient Position: Sitting)   Pulse 83   Ht 5' 9.5" (1.765 m)   Wt 206 lb (93.4 kg)   BMI 29.98 kg/m   Physical Exam  Back Exam   Tenderness  The patient is experiencing tenderness in the lumbar.  Range of Motion  Extension: normal  Flexion: normal  Lateral bend right: normal  Lateral bend left: normal  Rotation  right: normal  Rotation left: normal   Muscle Strength  Right Quadriceps:  5/5  Left Quadriceps:  5/5  Right Hamstrings:  5/5  Left Hamstrings:  5/5   Tests  Straight leg  raise right: negative Straight leg raise left: negative  Reflexes  Patellar: 2/4 Achilles: 2/4 Biceps: 2/4 Babinski's sign: normal   Other  Toe walk: normal Heel walk: normal Sensation: normal Erythema: no back redness Scars: absent      Specialty Comments:  No specialty comments available.  Imaging: No results found.   PMFS History: There are no active problems to display for this patient.  History reviewed. No pertinent past medical history.  History reviewed. No pertinent family history.  History reviewed. No pertinent surgical history. Social History   Occupational History  . Not on file  Tobacco Use  . Smoking status: Unknown If Ever Smoked  Substance and Sexual Activity  . Alcohol use: Not on file  . Drug use: Not on file  . Sexual activity: Not on file

## 2019-03-07 NOTE — Patient Instructions (Signed)
Plan: Avoid frequent bending and stooping  No lifting greater than 10 lbs. May use ice or moist heat for pain. Weight loss is of benefit. Discontinue the diclofenac  And see your primary care MD to be sure that your blood pressure returns to normal. Exercise is important to improve your indurance and does allow people to function better inspite of back pain. Tramadol for pain.

## 2019-03-11 ENCOUNTER — Emergency Department (HOSPITAL_COMMUNITY): Payer: 59

## 2019-03-11 ENCOUNTER — Emergency Department (HOSPITAL_COMMUNITY)
Admission: EM | Admit: 2019-03-11 | Discharge: 2019-03-11 | Disposition: A | Discharge status: Home / Self Care | Payer: 59 | Attending: Emergency Medicine | Admitting: Emergency Medicine

## 2019-03-11 ENCOUNTER — Encounter (HOSPITAL_COMMUNITY): Payer: Self-pay | Admitting: Emergency Medicine

## 2019-03-11 ENCOUNTER — Other Ambulatory Visit: Payer: Self-pay

## 2019-03-11 DIAGNOSIS — R9431 Abnormal electrocardiogram [ECG] [EKG]: Secondary | ICD-10-CM | POA: Diagnosis not present

## 2019-03-11 DIAGNOSIS — I129 Hypertensive chronic kidney disease with stage 1 through stage 4 chronic kidney disease, or unspecified chronic kidney disease: Secondary | ICD-10-CM | POA: Diagnosis not present

## 2019-03-11 DIAGNOSIS — Z7984 Long term (current) use of oral hypoglycemic drugs: Secondary | ICD-10-CM | POA: Diagnosis not present

## 2019-03-11 DIAGNOSIS — E1122 Type 2 diabetes mellitus with diabetic chronic kidney disease: Secondary | ICD-10-CM | POA: Insufficient documentation

## 2019-03-11 DIAGNOSIS — Z79899 Other long term (current) drug therapy: Secondary | ICD-10-CM | POA: Diagnosis not present

## 2019-03-11 DIAGNOSIS — I10 Essential (primary) hypertension: Secondary | ICD-10-CM | POA: Diagnosis not present

## 2019-03-11 DIAGNOSIS — R000 Tachycardia, unspecified: Secondary | ICD-10-CM | POA: Diagnosis not present

## 2019-03-11 DIAGNOSIS — R030 Elevated blood-pressure reading, without diagnosis of hypertension: Secondary | ICD-10-CM | POA: Diagnosis present

## 2019-03-11 DIAGNOSIS — N189 Chronic kidney disease, unspecified: Secondary | ICD-10-CM | POA: Diagnosis not present

## 2019-03-11 HISTORY — DX: Umbilical hernia without obstruction or gangrene: K42.9

## 2019-03-11 HISTORY — DX: Male erectile dysfunction, unspecified: N52.9

## 2019-03-11 HISTORY — DX: Testicular hypofunction: E29.1

## 2019-03-11 HISTORY — DX: Other intervertebral disc displacement, lumbar region: M51.26

## 2019-03-11 HISTORY — DX: Other intervertebral disc degeneration, lumbar region without mention of lumbar back pain or lower extremity pain: M51.369

## 2019-03-11 HISTORY — DX: Hyperlipidemia, unspecified: E78.5

## 2019-03-11 HISTORY — DX: Other intervertebral disc degeneration, lumbar region: M51.36

## 2019-03-11 HISTORY — DX: Type 2 diabetes mellitus without complications: E11.9

## 2019-03-11 HISTORY — DX: Essential (primary) hypertension: I10

## 2019-03-11 LAB — CBC WITH DIFFERENTIAL/PLATELET
Abs Immature Granulocytes: 0.05 10*3/uL (ref 0.00–0.07)
Basophils Absolute: 0 10*3/uL (ref 0.0–0.1)
Basophils Relative: 0 %
Eosinophils Absolute: 0.1 10*3/uL (ref 0.0–0.5)
Eosinophils Relative: 1 %
HCT: 44.5 % (ref 39.0–52.0)
Hemoglobin: 15 g/dL (ref 13.0–17.0)
Immature Granulocytes: 1 %
Lymphocytes Relative: 17 %
Lymphs Abs: 1.6 10*3/uL (ref 0.7–4.0)
MCH: 29.6 pg (ref 26.0–34.0)
MCHC: 33.7 g/dL (ref 30.0–36.0)
MCV: 87.8 fL (ref 80.0–100.0)
Monocytes Absolute: 1 10*3/uL (ref 0.1–1.0)
Monocytes Relative: 11 %
Neutro Abs: 6.7 10*3/uL (ref 1.7–7.7)
Neutrophils Relative %: 70 %
Platelets: 273 10*3/uL (ref 150–400)
RBC: 5.07 MIL/uL (ref 4.22–5.81)
RDW: 11.9 % (ref 11.5–15.5)
WBC: 9.4 10*3/uL (ref 4.0–10.5)
nRBC: 0 % (ref 0.0–0.2)

## 2019-03-11 LAB — COMPREHENSIVE METABOLIC PANEL
ALT: 37 U/L (ref 0–44)
AST: 24 U/L (ref 15–41)
Albumin: 4.6 g/dL (ref 3.5–5.0)
Alkaline Phosphatase: 38 U/L (ref 38–126)
Anion gap: 12 (ref 5–15)
BUN: 20 mg/dL (ref 8–23)
CO2: 24 mmol/L (ref 22–32)
Calcium: 9.8 mg/dL (ref 8.9–10.3)
Chloride: 102 mmol/L (ref 98–111)
Creatinine, Ser: 0.94 mg/dL (ref 0.61–1.24)
GFR calc Af Amer: >60 mL/min (ref >60)
GFR calc non Af Amer: >60 mL/min (ref >60)
Glucose, Bld: 153 mg/dL — ABNORMAL HIGH (ref 70–99)
Potassium: 4.2 mmol/L (ref 3.5–5.1)
Sodium: 138 mmol/L (ref 135–145)
Total Bilirubin: 0.8 mg/dL (ref 0.3–1.2)
Total Protein: 7.4 g/dL (ref 6.5–8.1)

## 2019-03-11 LAB — TROPONIN I (HIGH SENSITIVITY)
Troponin I (High Sensitivity): 6 ng/L (ref <18)
Troponin I (High Sensitivity): 10 ng/L (ref <18)

## 2019-03-11 LAB — D-DIMER, QUANTITATIVE: D-Dimer, Quant: <0.27 ug/mL-FEU (ref 0.00–0.50)

## 2019-03-11 NOTE — ED Triage Notes (Signed)
Pt reports he was sent by his Dr due to abnormal EKG with inverted T-waves wants him to have an echocardiogram. Pt reports pain to l leg radiating from back that has been going on for several weeks. Pt reports he has a bulging disc. Pt also had BP of 200/100 in the office. Pt's BP currently 178/99. He denies CP, SHOB, palpitations.

## 2019-03-11 NOTE — ED Provider Notes (Signed)
[facility] EMERGENCY DEPARTMENT Provider Note   CSN: [id number] Arrival date & time: 03/11/19  1144    History   Chief Complaint Chief Complaint  Patient presents with   Abnormal ECG    HPI Aaron Duarte is a 68 y.o. male.     The history is provided by the patient and medical records. No language interpreter was used.   Aaron Duarte is a 68 y.o. male who presents to the Emergency Department complaining of abnormal EKG. He was referred to the emergency department by his primary care provider at [facility] for abnormal EKG. He denies any current complaints but has recently noticed that his blood pressure is running high. On June 18 he suffered from abrupt onset left leg pain after bending. He was evaluated by orthopedics and this was attributed to a bulging disc. His leg pain is now gone. About one week ago he had his blood pressure checked and he was noted to be hypertensive. He has been compliant with his medications and increased amlodipine to 10 mg daily from 5 mg daily three days ago. When he saw his PCP today he was noted to still be hypertensive to 892 systolic. He has been compliant with his correct 12.5 mg twice daily as well as losartan 100 mg daily. He had been checking his blood pressure routinely in January and noticed that it was 119 to 417 systolic. He denies any chest pain, shortness of breath, abdominal pain, numbness, weakness. Per PCP report he had an EKG five years ago that was normal compared to today's. Today's EKG shows T-wave inversion in the inferior leads. Past Medical History:  Diagnosis Date   Bulging lumbar disc    CRF (chronic renal failure)    Diabetes mellitus without complication (HCC)    ED (erectile dysfunction)    Hyperlipidemia    Hypertension    Hypogonadism male    Umbilical hernia     There are no active problems to display for this patient.   No past surgical history on file.      Home  Medications    Prior to Admission medications   Medication Sig Start Date End Date Taking? Authorizing Provider  amLODipine (NORVASC) 5 MG tablet Take 5 mg by mouth daily. 01/13/19   [provider]  atorvastatin (LIPITOR) 40 MG tablet Take 40 mg by mouth daily. 12/20/18   [provider]  carvedilol (COREG) 12.5 MG tablet Take 12.5 mg by mouth 2 (two) times daily with a meal. 12/22/18   [provider]  furosemide (LASIX) 20 MG tablet Take 20 mg by mouth daily. 01/04/19   [provider]  metFORMIN (GLUCOPHAGE-XR) 500 MG 24 hr tablet Take 1,000 mg by mouth 2 (two) times daily. 11/30/18   [provider]  methocarbamol (ROBAXIN) 500 MG tablet Take 1 tablet (500 mg total) by mouth every 8 (eight) hours as needed for muscle spasms. 02/27/19   Lanae Crumbly, PA-C  traMADol (ULTRAM) 50 MG tablet Take 1 tablet (50 mg total) by mouth every 8 (eight) hours as needed. 03/07/19   Jessy Oto, MD  Vitamin D, Ergocalciferol, (DRISDOL) 1.25 MG (50000 UT) CAPS capsule Take 50,000 Units by mouth once a week. 01/14/19   [provider]    Family History No family history on file.  Social History Social History   Tobacco Use   Smoking status: Never Smoker   Smokeless tobacco: Never Used  Substance Use Topics   Alcohol  Use    Alcohol/week: 3.0 - 4.0 standard drinks    Types: 3 - 4 Cans of beer per week    Frequency: Never   Drug use: Yes     Allergies   Patient has no known allergies.   Review of Systems Review of Systems  All other systems reviewed and are negative.    Physical Exam Updated Vital Signs BP (!) 158/80    Pulse 76    Temp 98.6 F (37 C) (Oral)    Resp (!) 22    SpO2 96%   Physical Exam Vitals signs and nursing note reviewed.  Constitutional:      Appearance: He is well-developed.  HENT:     Head: Normocephalic and atraumatic.  Cardiovascular:     Rate and Rhythm: Normal rate and regular rhythm.     Heart sounds:  No murmur.  Pulmonary:     Effort: Pulmonary effort is normal. No respiratory distress.     Breath sounds: Normal breath sounds.  Abdominal:     Palpations: Abdomen is soft.     Tenderness: There is no abdominal tenderness. There is no guarding or rebound.  Musculoskeletal:        General: No tenderness.     Comments: 2+ DP pulses bilaterally  Skin:    General: Skin is warm and dry.  Neurological:     Mental Status: He is alert and oriented to person, place, and time.  Psychiatric:        Behavior: Behavior normal.      ED Treatments / Results  Labs (all labs ordered are listed, but only abnormal results are displayed) Labs Reviewed  COMPREHENSIVE METABOLIC PANEL - Abnormal; Notable for the following components:      Result Value   Glucose, Bld 153 (*)    All other components within normal limits  CBC WITH DIFFERENTIAL/PLATELET  D-DIMER, QUANTITATIVE (NOT AT [facility])  TROPONIN I (HIGH SENSITIVITY)  TROPONIN I (HIGH SENSITIVITY)    EKG EKG Interpretation  Date/Time:  Monday March 11 2019 12:04:10 EDT Ventricular Rate:  102 PR Interval:  152 QRS Duration: 90 QT Interval:  338 QTC Calculation: 440 R Axis:   72 Text Interpretation:  Sinus tachycardia T wave abnormality, consider inferior ischemia Abnormal ECG no prior available for comparison Confirmed by Quintella Reichert [phone]) on 03/11/2019 12:33:04 PM   Radiology Dg Chest 2 View  Result Date: 03/11/2019 CLINICAL DATA:  Hypertension. EXAM: CHEST - 2 VIEW COMPARISON:  MRI 03/02/2019.  05/15/2015.  CT 05/15/2015. FINDINGS: Mediastinum hilar structures normal. Heart size normal. Lungs are clear. No pleural effusion or pneumothorax. Lumbar spine numbered the lowest segmented appearing lumbar shaped vertebrae on lateral view on prior MRI as L5. T11 and L1 compression fractures again noted. Carotid vascular calcification. IMPRESSION: 1.  No acute cardiopulmonary disease. 2.  Carotid vascular disease. Electronically Signed   By:  Marcello Moores  Register   On: 03/11/2019 14:10    Procedures Procedures (including critical care time)  Medications Ordered in ED Medications - No data to display   Initial Impression / Assessment and Plan / ED Course  I have reviewed the triage vital signs and the nursing notes.  Pertinent labs & imaging results that were available during my care of the patient were reviewed by me and considered in my medical decision making (see chart for details).        Patient presents to the emergency department for evaluation of abnormal EKG, hypertensive in his PCP office. His blood  pressure improved in the emergency department without intervention. He is well appearing on evaluation with no acute complaints. EKG does show some T-wave inversion in the inferior and lateral leads, no priors available for comparison. EKG morphology consistent with LVH. Troponin is negative times two. Presentation is not consistent with ACS, PE, dissection, hypertensive urgency. Discussed with patient home care for hypertension, EKG abnormality. Discussed outpatient PCP and cardiology follow-up as well as return precautions.  Final Clinical Impressions(s) / ED Diagnoses   Final diagnoses:  Abnormal EKG  Essential hypertension    ED Discharge Orders         Ordered    Ambulatory referral to Cardiology    Comments: Abnormal EKG, referred to ED by PCP   03/11/19 1612           Quintella Reichert, MD 03/11/19 [phone]

## 2019-03-11 NOTE — ED Notes (Signed)
ED Provider at bedside. 

## 2019-03-14 DIAGNOSIS — I129 Hypertensive chronic kidney disease with stage 1 through stage 4 chronic kidney disease, or unspecified chronic kidney disease: Secondary | ICD-10-CM | POA: Diagnosis not present

## 2019-03-18 ENCOUNTER — Ambulatory Visit: Payer: 59 | Admitting: Cardiology

## 2019-03-19 ENCOUNTER — Encounter: Payer: 59 | Admitting: Neurology

## 2019-03-23 ENCOUNTER — Other Ambulatory Visit: Payer: Self-pay | Admitting: Surgery

## 2019-03-24 DIAGNOSIS — I6529 Occlusion and stenosis of unspecified carotid artery: Secondary | ICD-10-CM | POA: Insufficient documentation

## 2019-03-24 DIAGNOSIS — I119 Hypertensive heart disease without heart failure: Secondary | ICD-10-CM

## 2019-03-24 DIAGNOSIS — R9431 Abnormal electrocardiogram [ECG] [EKG]: Secondary | ICD-10-CM

## 2019-03-24 HISTORY — DX: Hypertensive heart disease without heart failure: I11.9

## 2019-03-24 HISTORY — DX: Abnormal electrocardiogram (ECG) (EKG): R94.31

## 2019-03-24 HISTORY — DX: Occlusion and stenosis of unspecified carotid artery: I65.29

## 2019-03-24 NOTE — Progress Notes (Signed)
Cardiology Office Note:    Date:  03/25/2019   ID:  Aaron Duarte, DOB 1951-04-16, MRN [id number]  PCP:  Merrilee Seashore, MD  Cardiologist:  Shirlee More, MD   Referring MD: Quintella Reichert, MD  ASSESSMENT:    1. Mixed hyperlipidemia   2. Abnormal electrocardiogram (ECG) (EKG)   3. Hypertensive heart disease without heart failure   4. Carotid artery calcification, unspecified laterality    PLAN:    In order of problems listed above:  1. Resistant hypertension continue multidrug regimen including calcium channel blocker ARB switching to a more potent prep beta-blocker increase in dosage and diuretic.  If he remains above target minoxidil he started next visit.  For further evaluation renal artery duplex along with aldosterone renin ratio. 2. Abnormal EKG most consistent LVH and repolarization check echo discussed ischemia evaluation next visit 3. Hyperlipidemia stable continue statin 4. Carotid artery calcification we will also do a carotid artery duplex regarding stenosis  Next   2 weeks   Medication Adjustments/Labs and Tests Ordered: Current medicines are reviewed at length with the patient today.  Concerns regarding medicines are outlined above.  No orders of the defined types were placed in this encounter.  No orders of the defined types were placed in this encounter.    No chief complaint on file.   History of Present Illness:    Aaron Duarte is a 68 y.o. male with HTN, HLD and DM2 who is being seen today for the evaluation of an abnormal EKG seen in [facility] ED at 03/11/2019 at the request of Quintella Reichert, MD.  He follows with orthopedics. His evaluation showed a low d-dimer and 2 normal high-sensitivity troponins and was felt to be appropriate for outpatient evaluation.  I independently reviewed the EKG that showed sinus rhythm and T wave changes ischemia versus repolarization in the inferior leads.  It should be noted that he had poorly controlled  hypertension and systolic blood pressures of 200 mmHg.  Chest x-ray showed no acute cardiopulmonary disease but he was noted to have vascular calcification including carotid artery.  He has a long history of hypertension and his blood pressure typically around the range of 696 295 systolic.  Recently had back pain sedentary activity blood pressures run in the range of 1 28-413 systolic diastolics 24-401.  EKG showed changes of ischemia versus LVH and repolarization he has had no chest pain shortness of breath edema orthopnea and no known kidney disease.  Despite multiple drugs he remains severely hypertensive appears to have resistant hypertension for further evaluation with a TSH a aldosterone to renin ratio and a renal artery duplex study and echocardiogram to look for changes of hypertensive heart disease and next visit will decide if he needs an ischemia evaluation.  He snores but not severely and I will think he needs a sleep study will optimize his antihypertensive therapy increase in the dose of carvedilol switching to a more potent ARB and if remains above target initiated minoxidil at the next visit.  I stressed the need for sodium restriction compliance of meds and daily recording of blood pressure twice a day Past Medical History:  Diagnosis Date  . Bulging lumbar disc   . Diabetes mellitus without complication ([place])   . ED (erectile dysfunction)   . Hyperlipidemia   . Hypertension   . Hypogonadism male   . Umbilical hernia     Past Surgical History:  Procedure Laterality Date  . APPENDECTOMY      Current  Medications: Current Meds  Medication Sig  . amLODipine (NORVASC) 5 MG tablet Take 10 mg by mouth daily.   [name] aspirin EC 81 MG tablet Take 81 mg by mouth 3 (three) times a week.  [name] atorvastatin (LIPITOR) 40 MG tablet Take 40 mg by mouth daily.  . carvedilol (COREG) 12.5 MG tablet Take 12.5 mg by mouth 2 (two) times daily with a meal.  . Echinacea Herb 400 MG CAPS Take 1 capsule  by mouth daily.  . furosemide (LASIX) 20 MG tablet Take 20 mg by mouth daily.  . Glucosamine Sulfate 1000 MG TABS Take 1 tablet by mouth daily.  [name] losartan (COZAAR) 100 MG tablet Take 100 mg by mouth daily.  . metFORMIN (GLUCOPHAGE-XR) 500 MG 24 hr tablet Take 1,000 mg by mouth 2 (two) times daily.  . Misc Natural Products (GINSENG COMPLEX PO) Take 1 tablet by mouth daily.  [name] MODAFINIL PO Take 1 tablet by mouth daily as needed.  . Multiple Vitamins-Minerals (CENTRUM SILVER PO) Take 1 tablet by mouth daily.  . sildenafil (REVATIO) 20 MG tablet Take 20 mg by mouth daily as needed.  . Vitamin D, Ergocalciferol, (DRISDOL) 1.25 MG (50000 UT) CAPS capsule Take 50,000 Units by mouth once a week.  . vitamin E 1000 UNIT capsule Take 1,000 Units by mouth daily.     Allergies:   Patient has no known allergies.   Social History   Socioeconomic History  . Marital status: Single    Spouse name: Not on file  . Number of children: Not on file  . Years of education: Not on file  . Highest education level: Not on file  Occupational History  . Not on file  Social Needs  . Financial resource strain: Not on file  . Food insecurity    Worry: Not on file    Inability: Not on file  . Transportation needs    Medical: Not on file    Non-medical: Not on file  Tobacco Use  . Smoking status: Never Smoker  . Smokeless tobacco: Never Used  Substance and Sexual Activity  . Alcohol Use    Alcohol/week: 3.0 - 4.0 standard drinks    Types: 3 - 4 Cans of beer per week    Frequency: Never    Comment: per week   . Drug use: Never  . Sexual activity: Yes  Lifestyle  . Physical activity    Days per week: Not on file    Minutes per session: Not on file  . Stress: Not on file  Relationships  . Social Herbalist on phone: Not on file    Gets together: Not on file    Attends religious service: Not on file    Active member of club or organization: Not on file    Attends meetings of clubs or  organizations: Not on file    Relationship status: Not on file  Other Topics Concern  . Not on file  Social History Narrative  . Not on file     Family History: The patient's family history includes Breast cancer in his sister; Cancer in his sister; Heart failure in his mother; Hypertension in his mother; Kidney disease in his father; Liver cancer in his father.  ROS:   Review of Systems  Constitution: Negative.  HENT: Negative.   Eyes: Negative.   Cardiovascular: Negative.   Respiratory: Positive for snoring.   Endocrine: Negative.   Hematologic/Lymphatic: Negative.   Skin: Negative.   Musculoskeletal:  Positive for back pain.  Gastrointestinal: Negative.   Genitourinary: Negative.   Neurological: Negative.   Psychiatric/Behavioral: The patient is nervous/anxious.   Allergic/Immunologic: Negative.    Please see the history of present illness.     All other systems reviewed and are negative.  EKGs/Labs/Other Studies Reviewed:    The following studies were reviewed today:   EKG:  EKG is  ordered today.  The ekg ordered today is personally reviewed and demonstrates similar pattern sinus rhythm LVH repolarization  Recent Labs: 03/11/2019: ALT 37; BUN 20; Creatinine, Ser 0.94; Hemoglobin 15.0; Platelets 273; Potassium 4.2; Sodium 138  Recent Lipid Panel No results found for: CHOL, TRIG, HDL, CHOLHDL, VLDL, LDLCALC, LDLDIRECT  Physical Exam:    VS:  BP (!) 198/102 (BP Location: Left Arm, Patient Position: Sitting, Cuff Size: Large)   Pulse 86   Ht 5\' 9"  (1.753 m)   Wt 221 lb (100.2 kg)   SpO2 97%   BMI 32.64 kg/m     Wt Readings from Last 3 Encounters:  03/25/19 221 lb (100.2 kg)  03/07/19 206 lb (93.4 kg)  02/27/19 206 lb (93.4 kg)     GEN:  Well nourished, well developed in no acute distress HEENT: Normal NECK: No JVD; No carotid bruits LYMPHATICS: No lymphadenopathy CARDIAC: He has a fourth heart sound RRR, no murmurs, rubs, gallops RESPIRATORY:  Clear to  auscultation without rales, wheezing or rhonchi  ABDOMEN: Soft, non-tender, non-distended he has umbilical hernia no bruit MUSCULOSKELETAL:  No edema; No deformity  SKIN: Warm and dry NEUROLOGIC:  Alert and oriented x 3 PSYCHIATRIC:  Normal affect     Signed, Shirlee More, MD  03/25/2019 3:47 PM    [facility]

## 2019-03-25 ENCOUNTER — Other Ambulatory Visit: Payer: Self-pay

## 2019-03-25 ENCOUNTER — Ambulatory Visit (INDEPENDENT_AMBULATORY_CARE_PROVIDER_SITE_OTHER): Payer: 59 | Admitting: Cardiology

## 2019-03-25 ENCOUNTER — Encounter: Payer: Self-pay | Admitting: Cardiology

## 2019-03-25 VITALS — BP 198/102 | HR 86 | Ht 69.0 in | Wt 221.0 lb

## 2019-03-25 DIAGNOSIS — R9431 Abnormal electrocardiogram [ECG] [EKG]: Secondary | ICD-10-CM | POA: Diagnosis not present

## 2019-03-25 DIAGNOSIS — I6529 Occlusion and stenosis of unspecified carotid artery: Secondary | ICD-10-CM | POA: Diagnosis not present

## 2019-03-25 DIAGNOSIS — I119 Hypertensive heart disease without heart failure: Secondary | ICD-10-CM

## 2019-03-25 DIAGNOSIS — E782 Mixed hyperlipidemia: Secondary | ICD-10-CM

## 2019-03-25 MED ORDER — CARVEDILOL 25 MG PO TABS: 25.0000 mg | ORAL_TABLET | Freq: Two times a day (BID) | ORAL | 2 refills | Status: DC

## 2019-03-25 MED ORDER — TELMISARTAN 40 MG PO TABS: 40.0000 mg | ORAL_TABLET | Freq: Every day | ORAL | 2 refills | Status: DC

## 2019-03-25 NOTE — Patient Instructions (Addendum)
Medication Instructions:  Your physician has recommended you make the following change in your medication:   STOP losartan  INCREASE carvedilol (coreg) 25 mg: Take 1 tablet twice daily  START telmisartan (micardis) 40 mg: Take 1 tablet daily   **Check your blood pressure twice daily at the same time every day and keep a log of these readings. Please bring this BP log with you to your next appointment for Dr. Bettina Gavia to review.   If you need a refill on your cardiac medications before your next appointment, please call your pharmacy.   Lab work: Your physician recommends that you return for lab work today: TSH, BMP, aldosterone/renin level.   If you have labs (blood work) drawn today and your tests are completely normal, you will receive your results only by: [name] MyChart Message (if you have MyChart) OR . A paper copy in the mail If you have any lab test that is abnormal or we need to change your treatment, we will call you to review the results.  Testing/Procedures: You had an EKG today.   Your physician has requested that you have a renal artery duplex. During this test, an ultrasound is used to evaluate blood flow to the kidneys. Allow one hour for this exam. Do not eat after midnight the day before and avoid carbonated beverages. Take your medications as you usually do.  Your physician has requested that you have an echocardiogram. Echocardiography is a painless test that uses sound waves to create images of your heart. It provides your doctor with information about the size and shape of your heart and how well your heart's chambers and valves are working. This procedure takes approximately one hour. There are no restrictions for this procedure.  Your physician has requested that you have a carotid duplex. This test is an ultrasound of the carotid arteries in your neck. It looks at blood flow through these arteries that supply the brain with blood. Allow one hour for this exam. There are  no restrictions or special instructions.  Follow-Up: At [facility], you and your health needs are our priority.  As part of our continuing mission to provide you with exceptional heart care, we have created designated Provider Care Teams.  These Care Teams include your primary Cardiologist (physician) and Advanced Practice Providers (APPs -  Physician Assistants and Nurse Practitioners) who all work together to provide you with the care you need, when you need it. You will need a follow up appointment in 3 weeks.       Echocardiogram An echocardiogram is a procedure that uses painless sound waves (ultrasound) to produce an image of the heart. Images from an echocardiogram can provide important information about:  Signs of coronary artery disease (CAD).  Aneurysm detection. An aneurysm is a weak or damaged part of an artery Ruggieri that bulges out from the normal force of blood pumping through the body.  Heart size and shape. Changes in the size or shape of the heart can be associated with certain conditions, including heart failure, aneurysm, and CAD.  Heart muscle function.  Heart valve function.  Signs of a past heart attack.  Fluid buildup around the heart.  Thickening of the heart muscle.  A tumor or infectious growth around the heart valves. Tell a health care provider about:  Any allergies you have.  All medicines you are taking, including vitamins, herbs, eye drops, creams, and over-the-counter medicines.  Any blood disorders you have.  Any surgeries you have had.  Any medical conditions you have.  Whether you are pregnant or may be pregnant. What are the risks? Generally, this is a safe procedure. However, problems may occur, including:  Allergic reaction to dye (contrast) that may be used during the procedure. What happens before the procedure? No specific preparation is needed. You may eat and drink normally. What happens during the procedure?   An IV  tube may be inserted into one of your veins.  You may receive contrast through this tube. A contrast is an injection that improves the quality of the pictures from your heart.  A gel will be applied to your chest.  A wand-like tool (transducer) will be moved over your chest. The gel will help to transmit the sound waves from the transducer.  The sound waves will harmlessly bounce off of your heart to allow the heart images to be captured in real-time motion. The images will be recorded on a computer. The procedure may vary among health care providers and hospitals. What happens after the procedure?  You may return to your normal, everyday life, including diet, activities, and medicines, unless your health care provider tells you not to do that. Summary  An echocardiogram is a procedure that uses painless sound waves (ultrasound) to produce an image of the heart.  Images from an echocardiogram can provide important information about the size and shape of your heart, heart muscle function, heart valve function, and fluid buildup around your heart.  You do not need to do anything to prepare before this procedure. You may eat and drink normally.  After the echocardiogram is completed, you may return to your normal, everyday life, unless your health care provider tells you not to do that. This information is not intended to replace advice given to you by your health care provider. Make sure you discuss any questions you have with your health care provider. Document Released: 07/29/2000 Document Revised: 11/22/2018 Document Reviewed: 09/03/2016 Elsevier Patient Education  2020 [employer].

## 2019-03-26 NOTE — Telephone Encounter (Signed)
Absolutely no more oral NSAIDs.  Patient was just seen by cardiology yesterday for abnormal EKG and hypertension and was also evaluated in the ER a couple weeks ago.  See Dr. Otho Ket last office note where he mentions discontinue diclofenac.

## 2019-03-28 ENCOUNTER — Encounter: Payer: 59 | Admitting: Neurology

## 2019-04-02 ENCOUNTER — Ambulatory Visit (HOSPITAL_BASED_OUTPATIENT_CLINIC_OR_DEPARTMENT_OTHER)
Admission: RE | Admit: 2019-04-02 | Discharge: 2019-04-02 | Disposition: A | Discharge status: Home / Self Care | Payer: 59 | Source: Ambulatory Visit | Attending: Cardiology | Admitting: Cardiology

## 2019-04-02 ENCOUNTER — Other Ambulatory Visit: Payer: Self-pay

## 2019-04-02 ENCOUNTER — Encounter: Payer: Self-pay | Admitting: Specialist

## 2019-04-02 DIAGNOSIS — I6529 Occlusion and stenosis of unspecified carotid artery: Secondary | ICD-10-CM | POA: Diagnosis not present

## 2019-04-02 DIAGNOSIS — E782 Mixed hyperlipidemia: Secondary | ICD-10-CM | POA: Diagnosis not present

## 2019-04-02 DIAGNOSIS — R9431 Abnormal electrocardiogram [ECG] [EKG]: Secondary | ICD-10-CM

## 2019-04-02 DIAGNOSIS — I119 Hypertensive heart disease without heart failure: Secondary | ICD-10-CM | POA: Diagnosis not present

## 2019-04-02 NOTE — Progress Notes (Signed)
Renal Doppler performed   04/02/19 Zara Council RDCS, RVT

## 2019-04-02 NOTE — Progress Notes (Signed)
  Echocardiogram 2D Echocardiogram has been performed.  Aaron Duarte 04/02/2019, 11:17 AM

## 2019-04-02 NOTE — Progress Notes (Signed)
Carotid Doppler performed   04/02/19 Cardell Peach RDCS, RVT

## 2019-04-03 ENCOUNTER — Telehealth: Payer: Self-pay | Admitting: *Deleted

## 2019-04-03 NOTE — Telephone Encounter (Signed)
I called and spoke with Sandi Raveling pt SO about the issue with the insurance and informed her I thought this was already taken care of for the Peer to Peer and that I will get a hold of [employer] to schedule a Peer to Peer on this pt so that we can get this resolved. Peer to Peer is scheduled for 04/05/19 between 11-1pm but will be confirmed by email sent to me. I gave the reviewer the office triage number and secondary Dr. Louanne Skye number.

## 2019-04-04 ENCOUNTER — Ambulatory Visit: Payer: 59 | Admitting: Specialist

## 2019-04-05 DIAGNOSIS — I10 Essential (primary) hypertension: Secondary | ICD-10-CM | POA: Insufficient documentation

## 2019-04-05 DIAGNOSIS — R93429 Abnormal radiologic findings on diagnostic imaging of unspecified kidney: Secondary | ICD-10-CM | POA: Insufficient documentation

## 2019-04-05 HISTORY — DX: Abnormal radiologic findings on diagnostic imaging of unspecified kidney: R93.429

## 2019-04-08 ENCOUNTER — Telehealth: Payer: Self-pay

## 2019-04-08 ENCOUNTER — Other Ambulatory Visit: Payer: Self-pay

## 2019-04-08 MED ORDER — AMLODIPINE BESYLATE 5 MG PO TABS: 10.0000 mg | ORAL_TABLET | Freq: Every day | ORAL | 1 refills | Status: DC

## 2019-04-08 NOTE — Telephone Encounter (Signed)
Patient has been notified of appointment for CTA abdomen/pelivs scheduled on 04/10/2019 at 3:00 at [facility].  Order faxed to [facility].

## 2019-04-10 DIAGNOSIS — K8020 Calculus of gallbladder without cholecystitis without obstruction: Secondary | ICD-10-CM | POA: Diagnosis not present

## 2019-04-10 DIAGNOSIS — E1121 Type 2 diabetes mellitus with diabetic nephropathy: Secondary | ICD-10-CM | POA: Diagnosis not present

## 2019-04-10 DIAGNOSIS — I129 Hypertensive chronic kidney disease with stage 1 through stage 4 chronic kidney disease, or unspecified chronic kidney disease: Secondary | ICD-10-CM | POA: Diagnosis not present

## 2019-04-10 DIAGNOSIS — N200 Calculus of kidney: Secondary | ICD-10-CM | POA: Diagnosis not present

## 2019-04-10 DIAGNOSIS — E1122 Type 2 diabetes mellitus with diabetic chronic kidney disease: Secondary | ICD-10-CM | POA: Diagnosis not present

## 2019-04-10 DIAGNOSIS — I6523 Occlusion and stenosis of bilateral carotid arteries: Secondary | ICD-10-CM | POA: Diagnosis not present

## 2019-04-10 DIAGNOSIS — E785 Hyperlipidemia, unspecified: Secondary | ICD-10-CM | POA: Diagnosis not present

## 2019-04-10 DIAGNOSIS — K429 Umbilical hernia without obstruction or gangrene: Secondary | ICD-10-CM | POA: Diagnosis not present

## 2019-04-10 DIAGNOSIS — I10 Essential (primary) hypertension: Secondary | ICD-10-CM | POA: Diagnosis not present

## 2019-04-10 DIAGNOSIS — R93429 Abnormal radiologic findings on diagnostic imaging of unspecified kidney: Secondary | ICD-10-CM | POA: Diagnosis not present

## 2019-04-10 DIAGNOSIS — N182 Chronic kidney disease, stage 2 (mild): Secondary | ICD-10-CM | POA: Diagnosis not present

## 2019-04-11 ENCOUNTER — Other Ambulatory Visit: Payer: Self-pay

## 2019-04-11 DIAGNOSIS — I10 Essential (primary) hypertension: Secondary | ICD-10-CM

## 2019-04-11 DIAGNOSIS — R93429 Abnormal radiologic findings on diagnostic imaging of unspecified kidney: Secondary | ICD-10-CM

## 2019-04-11 NOTE — Progress Notes (Signed)
cta

## 2019-04-13 NOTE — Progress Notes (Signed)
Cardiology Office Note:    Date:  04/15/2019   ID:  Aaron Duarte, DOB 11-30-1950, MRN [id number]  PCP:  Merrilee Seashore, MD  Cardiologist:  Shirlee More, MD    Referring MD: Merrilee Seashore, MD    ASSESSMENT:    1. Hypertensive heart disease without heart failure   2. Carotid artery calcification, unspecified laterality   3. Abnormal electrocardiogram (ECG) (EKG)    PLAN:    In order of problems listed above:  1. Marked improvement blood pressures consistently less than 1 [id number] systolic diastolics 80 or less at home.  Blood pressure repeat by me in the office 168/80.  At this time we will continue his current multidrug regimen including loop diuretic beta-blocker ARB calcium channel blocker and follow-up in 6 months. 2. Stable he has mild atherosclerosis no stenotic lesion on duplex 3. Secondary hypertension normal left ventricular systolic function   Next appointment: 6 months   Medication Adjustments/Labs and Tests Ordered: Current medicines are reviewed at length with the patient today.  Concerns regarding medicines are outlined above.  No orders of the defined types were placed in this encounter.  No orders of the defined types were placed in this encounter.   No chief complaint on file.   History of Present Illness:    Aaron Duarte is a 68 y.o. male with a hx of HTN, HLD and DM2  last seen 03/25/2019 for resistant HTN and abnormal EKG with LVH and repolarizatioin changes and carotid artery calcification. Compliance with diet, lifestyle and medications: Yes  He exercises daily and has no exercise intolerance chest pain shortness of breath palpitation or syncope.  He is pleased by the results of this testing.  He asked about the gallstones and umbilical hernia at this time I would not advise elective intervention.  We discussed the option of lowering another antihypertensive agent but the vast majority of his numbers are at target until next visit the goal is less  than 0000000 systolic less than 90 diastolic and ideal less than 130/80 we will continue to monitor and let me know if he is not at target on the majority of his measurements.  I encouraged continued weight loss sodium restriction exercise  Renal artery duplex 04/02/2019: Right: Normal size right kidney. Right renal Artery not visualized        Normal cortical thickness of right kidney. RRV flow present. Left:  Normal size of left kidney. Normal cortical thickness of the        left kidney. No evidence of left renal artery stenosis.  Abdominal CTA 04/10/2019: Both RA free of stenosis.  Carotid duplex 04/02/2019: Summary: Right Carotid: Velocities in the right ICA are consistent with a 1-39% stenosis. Left Carotid: Velocities in the left ICA are consistent with a 1-39% stenosis. Vertebrals:  Bilateral vertebral arteries demonstrate antegrade flow. Subclavians: Normal flow hemodynamics were seen in bilateral subclavian              arteries.  Echo 04/02/2019:  1. The left ventricle has normal systolic function with an ejection fraction of 60-65%. The cavity size was normal. Left ventricular diastolic Doppler parameters are consistent with impaired relaxation.  2. The right ventricle has normal systolic function. The cavity was normal. There is no increase in right ventricular Mcgowan thickness.  3. Mild thickening of the aortic valve. Aortic valve regurgitation was not assessed by color flow Doppler.  4. The aorta is normal unless otherwise noted.  Ref Range & Units 2wk ago  ALDOSTERONE 0.0 - 30.0 ng/dL 9.0   Comment: This test was developed and its performance characteristics  determined by [facility]. It has not been cleared or approved  by the Food and Drug Administration.   Renin 0.167 - 5.380 ng/mL/hr 3.219   Comment: This test was developed and its performance characteristics  determined by [facility]. It has not been cleared or approved  by the Food and Drug Administration.    ALDOS/RENIN RATIO 0.0 - 30.0 2.8   Comment:             Units:   ng/dL per ng/mL/    Past Medical History:  Diagnosis Date  . Bulging lumbar disc   . Diabetes mellitus without complication ([place])   . ED (erectile dysfunction)   . Hyperlipidemia   . Hypertension   . Hypogonadism male   . Umbilical hernia     Past Surgical History:  Procedure Laterality Date  . APPENDECTOMY      Current Medications: Current Meds  Medication Sig  . amLODipine (NORVASC) 10 MG tablet Take 10 mg by mouth daily.  [name] aspirin EC 81 MG tablet Take 81 mg by mouth 3 (three) times a week.  [name] atorvastatin (LIPITOR) 40 MG tablet Take 40 mg by mouth daily.  . carvedilol (COREG) 25 MG tablet Take 1 tablet (25 mg total) by mouth 2 (two) times daily.  . Echinacea Herb 400 MG CAPS Take 1 capsule by mouth daily.  . furosemide (LASIX) 20 MG tablet Take 20 mg by mouth daily.  . Glucosamine Sulfate 1000 MG TABS Take 1 tablet by mouth daily.  . metFORMIN (GLUCOPHAGE-XR) 500 MG 24 hr tablet Take 1,000 mg by mouth 2 (two) times daily.  . Misc Natural Products (GINSENG COMPLEX PO) Take 1 tablet by mouth daily.  [name] MODAFINIL PO Take 1 tablet by mouth daily as needed.  . Multiple Vitamins-Minerals (CENTRUM SILVER PO) Take 1 tablet by mouth daily.  . sildenafil (REVATIO) 20 MG tablet Take 20 mg by mouth daily as needed.  [name] telmisartan (MICARDIS) 40 MG tablet Take 1 tablet (40 mg total) by mouth daily.  . Vitamin D, Ergocalciferol, (DRISDOL) 1.25 MG (50000 UT) CAPS capsule Take 50,000 Units by mouth once a week.  . vitamin E 1000 UNIT capsule Take 1,000 Units by mouth daily.     Allergies:   Patient has no known allergies.   Social History   Socioeconomic History  . Marital status: Single    Spouse name: Not on file  . Number of children: Not on file  . Years of education: Not on file  . Highest education level: Not on file  Occupational History  . Not on file  Social Needs  . Financial resource  strain: Not on file  . Food insecurity    Worry: Not on file    Inability: Not on file  . Transportation needs    Medical: Not on file    Non-medical: Not on file  Tobacco Use  . Smoking status: Never Smoker  . Smokeless tobacco: Never Used  Substance and Sexual Activity  . Alcohol Use    Alcohol/week: 3.0 - 4.0 standard drinks    Types: 3 - 4 Cans of beer per week    Frequency: Never    Comment: per week   . Drug use: Never  . Sexual activity: Yes  Lifestyle  . Physical activity    Days per week: Not on file    Minutes per session: Not on file  .  Stress: Not on file  Relationships  . Social Herbalist on phone: Not on file    Gets together: Not on file    Attends religious service: Not on file    Active member of club or organization: Not on file    Attends meetings of clubs or organizations: Not on file    Relationship status: Not on file  Other Topics Concern  . Not on file  Social History Narrative  . Not on file     Family History: The patient's family history includes Breast cancer in his sister; Cancer in his sister; Heart failure in his mother; Hypertension in his mother; Kidney disease in his father; Liver cancer in his father. ROS:   Please see the history of present illness.    All other systems reviewed and are negative.  EKGs/Labs/Other Studies Reviewed:    The following studies were reviewed today:   Recent Labs: 03/11/2019: ALT 37; Hemoglobin 15.0; Platelets 273 03/25/2019: BUN 21; Creatinine, Ser 0.94; Potassium 4.5; Sodium 140; TSH 2.160  Recent Lipid Panel No results found for: CHOL, TRIG, HDL, CHOLHDL, VLDL, LDLCALC, LDLDIRECT  Physical Exam:    VS:  BP (!) 196/96   Pulse 77   Ht 5' 9.5" (1.765 m)   Wt 223 lb 12.8 oz (101.5 kg)   SpO2 97%   BMI 32.58 kg/m     Wt Readings from Last 3 Encounters:  04/15/19 223 lb 12.8 oz (101.5 kg)  03/25/19 221 lb (100.2 kg)  03/07/19 206 lb (93.4 kg)     GEN:  Well nourished, well  developed in no acute distress HEENT: Normal NECK: No JVD; No carotid bruits LYMPHATICS: No lymphadenopathy CARDIAC: RRR, no murmurs, rubs, gallops RESPIRATORY:  Clear to auscultation without rales, wheezing or rhonchi  ABDOMEN: Soft, non-tender, non-distended MUSCULOSKELETAL:  No edema; No deformity  SKIN: Warm and dry NEUROLOGIC:  Alert and oriented x 3 PSYCHIATRIC:  Normal affect    Signed, Shirlee More, MD  04/15/2019 1:33 PM    [facility]

## 2019-04-15 ENCOUNTER — Other Ambulatory Visit: Payer: Self-pay

## 2019-04-15 ENCOUNTER — Ambulatory Visit (INDEPENDENT_AMBULATORY_CARE_PROVIDER_SITE_OTHER): Payer: 59 | Admitting: Cardiology

## 2019-04-15 ENCOUNTER — Encounter: Payer: Self-pay | Admitting: Cardiology

## 2019-04-15 VITALS — BP 196/96 | HR 77 | Ht 69.5 in | Wt 223.8 lb

## 2019-04-15 DIAGNOSIS — R9431 Abnormal electrocardiogram [ECG] [EKG]: Secondary | ICD-10-CM | POA: Diagnosis not present

## 2019-04-15 DIAGNOSIS — I119 Hypertensive heart disease without heart failure: Secondary | ICD-10-CM

## 2019-04-15 DIAGNOSIS — I6529 Occlusion and stenosis of unspecified carotid artery: Secondary | ICD-10-CM | POA: Diagnosis not present

## 2019-04-15 MED ORDER — FUROSEMIDE 20 MG PO TABS: 20.0000 mg | ORAL_TABLET | Freq: Every day | ORAL | 1 refills | Status: DC

## 2019-04-15 MED ORDER — AMLODIPINE BESYLATE 10 MG PO TABS: 10.0000 mg | ORAL_TABLET | Freq: Every day | ORAL | 1 refills | Status: DC

## 2019-04-15 NOTE — Patient Instructions (Signed)

## 2019-04-17 DIAGNOSIS — Z23 Encounter for immunization: Secondary | ICD-10-CM | POA: Diagnosis not present

## 2019-04-17 DIAGNOSIS — E785 Hyperlipidemia, unspecified: Secondary | ICD-10-CM | POA: Diagnosis not present

## 2019-04-17 DIAGNOSIS — E1122 Type 2 diabetes mellitus with diabetic chronic kidney disease: Secondary | ICD-10-CM | POA: Diagnosis not present

## 2019-04-17 DIAGNOSIS — I129 Hypertensive chronic kidney disease with stage 1 through stage 4 chronic kidney disease, or unspecified chronic kidney disease: Secondary | ICD-10-CM | POA: Diagnosis not present

## 2019-04-17 DIAGNOSIS — E875 Hyperkalemia: Secondary | ICD-10-CM | POA: Diagnosis not present

## 2019-04-17 DIAGNOSIS — Z125 Encounter for screening for malignant neoplasm of prostate: Secondary | ICD-10-CM | POA: Diagnosis not present

## 2019-04-19 NOTE — Telephone Encounter (Signed)
I sw Jackie in reference to pt Aaron Duarte and I informed her I contacted [employer] this moring and they advised me that the Peer to Peer review had expired 14 days after the denial form was sent and now we are needing to do an appeal. I went online to [employer] as instructed and printed off form for Practioner and provider clamin and appeal request and filled it out and sent all pertinent ov notes to them. I contacted [facility] back and explained I had sent over the information to [place] and emailed her a copy for her records.

## 2019-05-16 ENCOUNTER — Other Ambulatory Visit: Payer: 59

## 2019-06-16 ENCOUNTER — Other Ambulatory Visit: Payer: Self-pay | Admitting: Cardiology

## 2019-06-19 ENCOUNTER — Other Ambulatory Visit: Payer: Self-pay | Admitting: Cardiology

## 2019-06-19 MED ORDER — TELMISARTAN 40 MG PO TABS: 40.0000 mg | ORAL_TABLET | Freq: Every day | ORAL | 0 refills | Status: DC

## 2019-06-19 NOTE — Telephone Encounter (Signed)
°*  STAT* If patient is at the pharmacy, call can be transferred to refill team.   1. Which medications need to be refilled? (please list name of each medication and dose if known) telmisartan (MICARDIS) 40 MG tablet  2. Which pharmacy/location (including street and city if local pharmacy) is medication to be sent to? [id number] - [place], [place] [address]  3. Do they need a 30 day or 90 day supply? [place]

## 2019-06-19 NOTE — Telephone Encounter (Signed)
[facility] [place] as requested.

## 2019-09-12 ENCOUNTER — Other Ambulatory Visit: Payer: Self-pay | Admitting: Cardiology

## 2019-10-08 ENCOUNTER — Other Ambulatory Visit: Payer: Self-pay | Admitting: Cardiology

## 2019-10-21 ENCOUNTER — Other Ambulatory Visit: Payer: Self-pay | Admitting: *Deleted

## 2019-10-21 ENCOUNTER — Telehealth: Payer: Self-pay | Admitting: Cardiology

## 2019-10-21 MED ORDER — AMLODIPINE BESYLATE 10 MG PO TABS: 10.0000 mg | ORAL_TABLET | Freq: Every day | ORAL | 1 refills | Status: DC

## 2019-10-21 NOTE — Telephone Encounter (Signed)
Refill sent.

## 2019-10-21 NOTE — Telephone Encounter (Signed)
amLODipine (NORVASC) 10 MG tablet  Take 1 tablet by mouth every day

## 2019-10-30 LAB — ALDOSTERONE + RENIN ACTIVITY W/ RATIO
ALDOS/RENIN RATIO: 4.8 (ref 0.0–30.0)
ALDOSTERONE: 9 ng/dL (ref 0.0–30.0)
Renin: 1.884 ng/mL/hr (ref 0.167–5.380)

## 2019-10-30 LAB — BASIC METABOLIC PANEL
BUN/Creatinine Ratio: 22 (ref 10–24)
BUN: 21 mg/dL (ref 8–27)
CO2: 24 mmol/L (ref 20–29)
Calcium: 10.1 mg/dL (ref 8.6–10.2)
Chloride: 99 mmol/L (ref 96–106)
Creatinine, Ser: 0.94 mg/dL (ref 0.76–1.27)
GFR calc Af Amer: 96 mL/min/{1.73_m2} (ref >59)
GFR calc non Af Amer: 83 mL/min/{1.73_m2} (ref >59)
Glucose: 188 mg/dL — ABNORMAL HIGH (ref 65–99)
Potassium: 4.5 mmol/L (ref 3.5–5.2)
Sodium: 140 mmol/L (ref 134–144)

## 2019-10-30 LAB — TSH: TSH: 2.16 u[IU]/mL (ref 0.450–4.500)

## 2019-10-31 ENCOUNTER — Other Ambulatory Visit: Payer: Self-pay | Admitting: *Deleted

## 2019-10-31 MED ORDER — FUROSEMIDE 20 MG PO TABS: 20.0000 mg | ORAL_TABLET | Freq: Every day | ORAL | 0 refills | Status: DC

## 2019-11-26 ENCOUNTER — Other Ambulatory Visit: Payer: Self-pay | Admitting: Cardiology

## 2019-12-10 ENCOUNTER — Other Ambulatory Visit: Payer: Self-pay

## 2019-12-10 NOTE — Progress Notes (Signed)
Cardiology Office Note:    Date:  12/11/2019   ID:  Aaron Duarte, DOB 11-23-1950, MRN [id number]  PCP:  Merrilee Seashore, MD  Cardiologist:  Shirlee More, MD    Referring MD: Merrilee Seashore, MD    ASSESSMENT:    1. Essential hypertension   2. Type 2 diabetes mellitus without complication, without long-term current use of insulin ([place])   3. Mixed hyperlipidemia    PLAN:    In order of problems listed above:  1. His blood pressure is truly dialed in at target and markedly improved with his SGLT2 inhibitor.  We will continue his multidrug regimen including calcium channel blocker beta-blocker and high intensity ARB.  Labs are followed in his PCP office.  I do think that the addition of the SGLT2 inhibitors have a marked beneficial effect 2. Stable pending repeat A1c continue Metformin 3. Continue a statin labs are done his PCP office   Next appointment: 1 year   Medication Adjustments/Labs and Tests Ordered: Current medicines are reviewed at length with the patient today.  Concerns regarding medicines are outlined above.  No orders of the defined types were placed in this encounter.  No orders of the defined types were placed in this encounter.   Chief Complaint  Patient presents with  . Follow-up  . Hypertension    History of Present Illness:    Aaron Duarte is a 69 y.o. male with a hx of hypertensive heart disease with LVH on EKG carotid artery calcification hyperlipidemia and type 2 diabetes.  He was last seen 04/15/2019.  He had resistant hypertension and evaluation showedan  abnormal renal artery duplex and abdominal CTA with no evidence of renal artery stenosis and echocardiogram showing normal left ventricular function EKG showing left atrial abnormality and inferior T wave inversion.Aaron Duarte He was recently put on for CGM and his blood pressures are consistently less than 130/70-75.  He exercises daily and feels well has had no exercise intolerance chest pain shortness  of breath palpitation or syncope.  His EKG is stable today and I would describe this T wave changes is nonspecific.  Last hemoglobin A1c is 8.0 has upcoming labs his PCP office Compliance with diet, lifestyle and medications: Yes  He tolerates his antihypertensives without side effect. Past Medical History:  Diagnosis Date  . Abnormal electrocardiogram (ECG) (EKG) 03/24/2019  . Abnormal renal ultrasound 04/05/2019  . Bulging lumbar disc   . Carotid artery calcification 03/24/2019  . Diabetes mellitus without complication ([place])   . ED (erectile dysfunction)   . Hyperlipidemia   . Hypertension   . Hypertensive heart disease 03/24/2019  . Hypogonadism male   . Umbilical hernia     Past Surgical History:  Procedure Laterality Date  . APPENDECTOMY      Current Medications: Current Meds  Medication Sig  . amLODipine (NORVASC) 10 MG tablet Take 1 tablet (10 mg total) by mouth daily.  Aaron Duarte aspirin EC 81 MG tablet Take 81 mg by mouth 3 (three) times a week.  Aaron Duarte atorvastatin (LIPITOR) 40 MG tablet Take 40 mg by mouth daily.  . carvedilol (COREG) 25 MG tablet TAKE 1 TABLET BY MOUTH TWICE A DAY  . Echinacea Herb 400 MG CAPS Take 1 capsule by mouth daily.  Aaron Duarte FARXIGA 10 MG TABS tablet Take 10 mg by mouth daily.  . furosemide (LASIX) 20 MG tablet TAKE 1 TABLET (20 MG TOTAL) BY MOUTH DAILY. PT IS OVERDUE FOR AN APPT. PLEASE CALL TO SCHEDULE  . Glucosamine Sulfate 1000  MG TABS Take 1 tablet by mouth daily.  . metFORMIN (GLUCOPHAGE-XR) 500 MG 24 hr tablet Take 1,000 mg by mouth 2 (two) times daily.  . Misc Natural Products (GINSENG COMPLEX PO) Take 1 tablet by mouth daily.  Aaron Duarte MODAFINIL PO Take 1 tablet by mouth daily as needed.  . Multiple Vitamins-Minerals (CENTRUM SILVER PO) Take 1 tablet by mouth daily.  . sildenafil (REVATIO) 20 MG tablet Take 20 mg by mouth daily as needed.  Aaron Duarte telmisartan (MICARDIS) 40 MG tablet TAKE 1 TABLET BY MOUTH EVERY DAY  . Vitamin D, Ergocalciferol, (DRISDOL) 1.25 MG (50000  UT) CAPS capsule Take 50,000 Units by mouth once a week.  . vitamin E 1000 UNIT capsule Take 1,000 Units by mouth daily.     Allergies:   Patient has no known allergies.   Social History   Socioeconomic History  . Marital status: Single    Spouse name: Not on file  . Number of children: Not on file  . Years of education: Not on file  . Highest education level: Not on file  Occupational History  . Not on file  Tobacco Use  . Smoking status: Never Smoker  . Smokeless tobacco: Never Used  Substance and Sexual Activity  . Alcohol use: Not on file    Comment: per week   . Drug use: Never  . Sexual activity: Yes  Other Topics Concern  . Not on file  Social History Narrative  . Not on file   Social Determinants of Health   Financial Resource Strain:   . Difficulty of Paying Living Expenses:   Food Insecurity:   . Worried About Charity fundraiser in the Last Year:   . Arboriculturist in the Last Year:   Transportation Needs:   . Film/video editor (Medical):   Aaron Duarte Lack of Transportation (Non-Medical):   Physical Activity:   . Days of Exercise per Week:   . Minutes of Exercise per Session:   Stress:   . Feeling of Stress :   Social Connections:   . Frequency of Communication with Friends and Family:   . Frequency of Social Gatherings with Friends and Family:   . Attends Religious Services:   . Active Member of Clubs or Organizations:   . Attends Archivist Meetings:   Aaron Duarte Marital Status:      Family History: The patient's family history includes Breast cancer in his sister; Cancer in his sister; Heart failure in his mother; Hypertension in his mother; Kidney disease in his father; Liver cancer in his father. ROS:   Please see the history of present illness.    All other systems reviewed and are negative.  EKGs/Labs/Other Studies Reviewed:    The following studies were reviewed today:  EKG:  EKG ordered today and personally reviewed.  The ekg ordered  today demonstrates sinus rhythm improved nonspecific inferior T wave changes  Recent Labs: 03/11/2019: ALT 37; Hemoglobin 15.0; Platelets 273 03/25/2019: BUN 21; Creatinine, Ser 0.94; Potassium 4.5; Sodium 140; TSH 2.160  Recent Lipid Panel No results found for: CHOL, TRIG, HDL, CHOLHDL, VLDL, LDLCALC, LDLDIRECT  Physical Exam:    VS:  BP (!) 152/100 (BP Location: Right Arm, Patient Position: Sitting, Cuff Size: Normal)   Pulse 79   Temp 97.8 F (36.6 C)   Ht 5' 9.5" (1.765 m)   Wt 222 lb 12.8 oz (101.1 kg)   SpO2 97%   BMI 32.43 kg/m     Wt Readings  from Last 3 Encounters:  12/11/19 222 lb 12.8 oz (101.1 kg)  04/15/19 223 lb 12.8 oz (101.5 kg)  03/25/19 221 lb (100.2 kg)     GEN:  Well nourished, well developed in no acute distress HEENT: Normal NECK: No JVD; No carotid bruits LYMPHATICS: No lymphadenopathy CARDIAC: RRR, no murmurs, rubs, gallops RESPIRATORY:  Clear to auscultation without rales, wheezing or rhonchi  ABDOMEN: Soft, non-tender, non-distended MUSCULOSKELETAL:  No edema; No deformity  SKIN: Warm and dry NEUROLOGIC:  Alert and oriented x 3 PSYCHIATRIC:  Normal affect    Signed, Shirlee More, MD  12/11/2019 8:40 AM    [place]

## 2019-12-11 ENCOUNTER — Encounter: Payer: Self-pay | Admitting: Cardiology

## 2019-12-11 ENCOUNTER — Other Ambulatory Visit: Payer: Self-pay

## 2019-12-11 ENCOUNTER — Ambulatory Visit (INDEPENDENT_AMBULATORY_CARE_PROVIDER_SITE_OTHER): Payer: Medicare Other | Admitting: Cardiology

## 2019-12-11 VITALS — BP 152/100 | HR 79 | Temp 97.8°F | Ht 69.5 in | Wt 222.8 lb

## 2019-12-11 DIAGNOSIS — E782 Mixed hyperlipidemia: Secondary | ICD-10-CM | POA: Diagnosis not present

## 2019-12-11 DIAGNOSIS — E119 Type 2 diabetes mellitus without complications: Secondary | ICD-10-CM | POA: Diagnosis not present

## 2019-12-11 DIAGNOSIS — I10 Essential (primary) hypertension: Secondary | ICD-10-CM

## 2019-12-11 NOTE — Patient Instructions (Signed)

## 2019-12-11 NOTE — Addendum Note (Signed)
Addended by: Jerl Santos R on: 12/11/2019 11:41 AM   Modules accepted: Orders

## 2019-12-12 ENCOUNTER — Other Ambulatory Visit: Payer: Self-pay

## 2019-12-12 ENCOUNTER — Telehealth: Payer: Self-pay | Admitting: Cardiology

## 2019-12-12 MED ORDER — CARVEDILOL 25 MG PO TABS: 25.0000 mg | ORAL_TABLET | Freq: Two times a day (BID) | ORAL | 1 refills | Status: DC

## 2019-12-12 NOTE — Telephone Encounter (Signed)
I called the patients pharmacy just now and they stated that they just needed a refill sent in for this medication. I sent in this refill just now.   I spoke with the patient as well and let him know that it should be taken care of.    Encouraged patient to call back with any questions or concerns.

## 2019-12-12 NOTE — Telephone Encounter (Signed)
Pt c/o medication issue:  1. Name of Medication: carvedilol (COREG) 25 MG tablet  2. How are you currently taking this medication (dosage and times per day)? As directed  3. Are you having a reaction (difficulty breathing--STAT)? no  4. What is your medication issue? Patient states he has gotten a few texts from his pharmacy about them having a hard time getting confirmation from Dr. Bettina Gavia on this medication. Please Advise. Patient states that he is not out of the medication.

## 2019-12-20 ENCOUNTER — Other Ambulatory Visit: Payer: Self-pay | Admitting: Cardiology

## 2020-01-21 ENCOUNTER — Ambulatory Visit: Payer: Self-pay

## 2020-01-21 ENCOUNTER — Other Ambulatory Visit: Payer: Self-pay

## 2020-01-21 ENCOUNTER — Encounter: Payer: Self-pay | Admitting: Orthopaedic Surgery

## 2020-01-21 ENCOUNTER — Ambulatory Visit (INDEPENDENT_AMBULATORY_CARE_PROVIDER_SITE_OTHER): Payer: Medicare Other | Admitting: Orthopaedic Surgery

## 2020-01-21 VITALS — BP 182/85 | HR 74 | Ht 69.5 in | Wt 205.0 lb

## 2020-01-21 DIAGNOSIS — S42112A Displaced fracture of body of scapula, left shoulder, initial encounter for closed fracture: Secondary | ICD-10-CM | POA: Diagnosis not present

## 2020-01-21 DIAGNOSIS — M25512 Pain in left shoulder: Secondary | ICD-10-CM

## 2020-01-21 DIAGNOSIS — R0781 Pleurodynia: Secondary | ICD-10-CM | POA: Diagnosis not present

## 2020-01-21 DIAGNOSIS — S42102A Fracture of unspecified part of scapula, left shoulder, initial encounter for closed fracture: Secondary | ICD-10-CM | POA: Insufficient documentation

## 2020-01-21 DIAGNOSIS — S2249XA Multiple fractures of ribs, unspecified side, initial encounter for closed fracture: Secondary | ICD-10-CM | POA: Insufficient documentation

## 2020-01-21 DIAGNOSIS — S2242XA Multiple fractures of ribs, left side, initial encounter for closed fracture: Secondary | ICD-10-CM | POA: Diagnosis not present
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
## 2020-01-29 ENCOUNTER — Ambulatory Visit: Payer: Self-pay

## 2020-01-29 ENCOUNTER — Encounter: Payer: Self-pay | Admitting: Orthopaedic Surgery

## 2020-01-29 ENCOUNTER — Ambulatory Visit (INDEPENDENT_AMBULATORY_CARE_PROVIDER_SITE_OTHER): Payer: Medicare Other | Admitting: Orthopaedic Surgery

## 2020-01-29 ENCOUNTER — Ambulatory Visit: Payer: Medicare Other | Admitting: Surgery

## 2020-01-29 ENCOUNTER — Other Ambulatory Visit: Payer: Self-pay

## 2020-01-29 VITALS — Ht 69.5 in | Wt 205.0 lb

## 2020-01-29 DIAGNOSIS — S2242XA Multiple fractures of ribs, left side, initial encounter for closed fracture: Secondary | ICD-10-CM | POA: Diagnosis not present

## 2020-01-30 NOTE — Progress Notes (Signed)
Office Visit Note   Patient: Aaron Duarte           Date of Birth: September 29, 1950           MRN: [id number] Visit Date: 01/29/2020              Requested by: Merrilee Seashore, [address] [place],  [place] [address] PCP: Merrilee Seashore, MD   Assessment & Plan: Visit Diagnoses:  1. Closed fracture of multiple ribs of left side, initial encounter     Plan: We reviewed previous x-rays that showed complete displacement of the scapular fracture involving of the body of the scapula which was extra-articular.  He needs to just rest and allow this time to heal before he tries start working on range of motion we discussed potential for nonunion if he continues to work on motion of the shoulder.  Chest x-ray reviewed the understands there is no evidence of pneumothorax.  Recheck 1 month.  Follow-Up Instructions: Return in about 1 month (around 02/28/2020).   Orders:  Orders Placed This Encounter  Procedures   XR Chest 2 View   No orders of the defined types were placed in this encounter.     Procedures: No procedures performed   Clinical Data: No additional findings.   Subjective: Chief Complaint  Patient presents with   Chest - Follow-up    HPI follow-up left scapular fracture extra-articular displaced and multiple left rib fractures.  No dyspnea.  Patient did have past history of pneumothorax from another injury and is aware of associated symptoms with the pneumothorax.  He has had popping and cracking he has noticed pain with range of motion of his left shoulder.  Review of Systems updated and unchanged.   Objective: Vital Signs: Ht 5' 9.5" (1.765 m)    Wt 205 lb (93 kg)    BMI 29.84 kg/m   Physical Exam Constitutional:      Appearance: He is well-developed.  HENT:     Head: Normocephalic and atraumatic.  Eyes:     Pupils: Pupils are equal, round, and reactive to light.  Neck:     Thyroid: No thyromegaly.     Trachea: No tracheal  deviation.  Cardiovascular:     Rate and Rhythm: Normal rate.  Pulmonary:     Effort: Pulmonary effort is normal.     Breath sounds: No wheezing.  Abdominal:     General: Bowel sounds are normal.     Palpations: Abdomen is soft.  Skin:    General: Skin is warm and dry.     Capillary Refill: Capillary refill takes less than 2 seconds.  Neurological:     Mental Status: He is alert and oriented to person, place, and time.  Psychiatric:        Behavior: Behavior normal.        Thought Content: Thought content normal.        Judgment: Judgment normal.     Ortho Exam pain with passive range of motion left shoulder.  No wheezing no dyspnea.  Specialty Comments:  No specialty comments available.  Imaging: No results found.   PMFS History: Patient Active Problem List   Diagnosis Date Noted   Multiple rib fractures 01/21/2020   Closed left scapular fracture 01/21/2020   Hypertension 04/05/2019   Abnormal renal ultrasound 04/05/2019   Abnormal electrocardiogram (ECG) (EKG) 03/24/2019   Hypertensive heart disease 03/24/2019   Carotid artery calcification 03/24/2019   Past Medical History:  Diagnosis Date  Abnormal electrocardiogram (ECG) (EKG) 03/24/2019   Abnormal renal ultrasound 04/05/2019   Bulging lumbar disc    Carotid artery calcification 03/24/2019   Diabetes mellitus without complication [facility])    ED (erectile dysfunction)    Hyperlipidemia    Hypertension    Hypertensive heart disease 03/24/2019   Hypogonadism male    Umbilical hernia     Family History  Problem Relation Age of Onset   Heart failure Mother    Hypertension Mother    Kidney disease Father    Liver cancer Father    Cancer Sister    Breast cancer Sister     Past Surgical History:  Procedure Laterality Date   APPENDECTOMY     Social History   Occupational History   Not on file  Tobacco Use   Smoking status: Never Smoker   Smokeless tobacco: Never Used  Vaping  Use   Vaping Use: Never used  Substance and Sexual Activity   Alcohol use: Not on file    Comment: per week    Drug use: Never   Sexual activity: Yes

## 2020-03-03 ENCOUNTER — Encounter: Payer: Self-pay | Admitting: Orthopaedic Surgery

## 2020-03-03 ENCOUNTER — Ambulatory Visit (INDEPENDENT_AMBULATORY_CARE_PROVIDER_SITE_OTHER): Payer: Medicare Other

## 2020-03-03 ENCOUNTER — Ambulatory Visit (INDEPENDENT_AMBULATORY_CARE_PROVIDER_SITE_OTHER): Payer: Medicare Other | Admitting: Orthopaedic Surgery

## 2020-03-03 VITALS — BP 172/88 | HR 66 | Ht 69.75 in | Wt 205.0 lb

## 2020-03-03 DIAGNOSIS — S42112A Displaced fracture of body of scapula, left shoulder, initial encounter for closed fracture: Secondary | ICD-10-CM | POA: Diagnosis not present

## 2020-03-03 DIAGNOSIS — S2242XA Multiple fractures of ribs, left side, initial encounter for closed fracture: Secondary | ICD-10-CM

## 2020-03-03 NOTE — Progress Notes (Signed)
Office Visit Note   Patient: Aaron Duarte           Date of Birth: [birth year]           MRN: [id number] Visit Date: 03/03/2020              Requested by: Merrilee Seashore, [address] [place],  [place] [address] PCP: Merrilee Seashore, MD   Assessment & Plan: Visit Diagnoses:  1. Closed fracture of multiple ribs of left side, initial encounter   2. Closed displaced fracture of body of left scapula, initial encounter     Plan: Patient can continue to do would be gentle with the shoulder.  We will recheck him in 9 months repeat images left scapula 2 views on return.  He can resume cardio work core strengthening just skip scapula and upper arm exercises.  He states he is back to all activities of normal daily living and is happy the progress.   Follow-Up Instructions: No follow-ups on file.   Orders:  Orders Placed This Encounter  Procedures  . XR Scapula Left  . XR Ribs Unilateral Left   No orders of the defined types were placed in this encounter.     Procedures: No procedures performed   Clinical Data: No additional findings.   Subjective: Chief Complaint  Patient presents with  . Left Shoulder - Fracture, Follow-up    DOI 01/17/2020  . Chest - Fracture, Follow-up    Left rib fxs DOI 01/17/2020    HPI patient returns post left rib fractures and left scapular fracture vertebral body displaced on 01/17/2020.  Patient's been able get his arm up in abduction and flexion 90% range of motion the opposite arm.  He is able to drive.  He has no rib pain no pain with inspiration or coughing.  He has not resumed his normal workout.  He had 3 machines he did which were presses pull downs and chest pulls.  Review of Systems updated unchanged from previous office visit.   Objective: Vital Signs: BP (!) 172/88   Pulse 66   Ht 5' 9.75" (1.772 m)   Wt 205 lb (93 kg)   BMI 29.63 kg/m   Physical Exam Constitutional:      Appearance: He is well-developed.   HENT:     Head: Normocephalic and atraumatic.  Eyes:     Pupils: Pupils are equal, round, and reactive to light.  Neck:     Thyroid: No thyromegaly.     Trachea: No tracheal deviation.  Cardiovascular:     Rate and Rhythm: Normal rate.  Pulmonary:     Effort: Pulmonary effort is normal.     Breath sounds: No wheezing.  Abdominal:     General: Bowel sounds are normal.     Palpations: Abdomen is soft.  Skin:    General: Skin is warm and dry.     Capillary Refill: Capillary refill takes less than 2 seconds.  Neurological:     Mental Status: He is alert and oriented to person, place, and time.  Psychiatric:        Behavior: Behavior normal.        Thought Content: Thought content normal.        Judgment: Judgment normal.     Ortho Exam patient is 9% flexion and abduction.  Slight inferior position of the left shoulder versus contralateral right.  Specialty Comments:  No specialty comments available.  Imaging: No results found.   PMFS History:  Patient Active Problem List   Diagnosis Date Noted  . Multiple rib fractures 01/21/2020  . Closed left scapular fracture 01/21/2020  . Hypertension 04/05/2019  . Abnormal renal ultrasound 04/05/2019  . Abnormal electrocardiogram (ECG) (EKG) 03/24/2019  . Hypertensive heart disease 03/24/2019  . Carotid artery calcification 03/24/2019   Past Medical History:  Diagnosis Date  . Abnormal electrocardiogram (ECG) (EKG) 03/24/2019  . Abnormal renal ultrasound 04/05/2019  . Bulging lumbar disc   . Carotid artery calcification 03/24/2019  . Diabetes mellitus without complication ([place])   . ED (erectile dysfunction)   . Hyperlipidemia   . Hypertension   . Hypertensive heart disease 03/24/2019  . Hypogonadism male   . Umbilical hernia     Family History  Problem Relation Age of Onset  . Heart failure Mother   . Hypertension Mother   . Kidney disease Father   . Liver cancer Father   . Cancer Sister   . Breast cancer Sister       Past Surgical History:  Procedure Laterality Date  . APPENDECTOMY     Social History   Occupational History  . Not on file  Tobacco Use  . Smoking status: Never Smoker  . Smokeless tobacco: Never Used  Vaping Use  . Vaping Use: Never used  Substance and Sexual Activity  . Alcohol use: Not on file    Comment: per week   . Drug use: Never  . Sexual activity: Yes

## 2020-04-02 ENCOUNTER — Other Ambulatory Visit: Payer: Self-pay | Admitting: Cardiology

## 2020-04-11 ENCOUNTER — Other Ambulatory Visit: Payer: Self-pay | Admitting: Cardiology

## 2020-05-30 ENCOUNTER — Other Ambulatory Visit: Payer: Self-pay | Admitting: Cardiology

## 2020-09-24 ENCOUNTER — Other Ambulatory Visit: Payer: Self-pay | Admitting: Cardiology

## 2020-09-24 NOTE — Telephone Encounter (Signed)
Furosemide 20 mg tablet # 90 with 2 refills sent to [place], [place]

## 2020-10-13 ENCOUNTER — Other Ambulatory Visit: Payer: Self-pay | Admitting: Cardiology

## 2020-10-13 NOTE — Telephone Encounter (Signed)
Amlodipine approved and sent 

## 2020-10-14 DIAGNOSIS — N182 Chronic kidney disease, stage 2 (mild): Secondary | ICD-10-CM | POA: Diagnosis not present

## 2020-10-14 DIAGNOSIS — E1165 Type 2 diabetes mellitus with hyperglycemia: Secondary | ICD-10-CM | POA: Diagnosis not present

## 2020-10-14 DIAGNOSIS — I129 Hypertensive chronic kidney disease with stage 1 through stage 4 chronic kidney disease, or unspecified chronic kidney disease: Secondary | ICD-10-CM | POA: Diagnosis not present

## 2020-10-14 DIAGNOSIS — Z0000 Encounter for general adult medical examination without abnormal findings: Secondary | ICD-10-CM | POA: Diagnosis not present

## 2020-10-14 DIAGNOSIS — E1122 Type 2 diabetes mellitus with diabetic chronic kidney disease: Secondary | ICD-10-CM | POA: Diagnosis not present

## 2020-10-14 DIAGNOSIS — Z125 Encounter for screening for malignant neoplasm of prostate: Secondary | ICD-10-CM | POA: Diagnosis not present

## 2020-10-21 DIAGNOSIS — N182 Chronic kidney disease, stage 2 (mild): Secondary | ICD-10-CM | POA: Diagnosis not present

## 2020-10-21 DIAGNOSIS — Z0000 Encounter for general adult medical examination without abnormal findings: Secondary | ICD-10-CM | POA: Diagnosis not present

## 2020-10-21 DIAGNOSIS — E1122 Type 2 diabetes mellitus with diabetic chronic kidney disease: Secondary | ICD-10-CM | POA: Diagnosis not present

## 2020-10-21 DIAGNOSIS — E782 Mixed hyperlipidemia: Secondary | ICD-10-CM | POA: Diagnosis not present

## 2020-10-21 DIAGNOSIS — I129 Hypertensive chronic kidney disease with stage 1 through stage 4 chronic kidney disease, or unspecified chronic kidney disease: Secondary | ICD-10-CM | POA: Diagnosis not present

## 2020-10-21 DIAGNOSIS — K2270 Barrett's esophagus without dysplasia: Secondary | ICD-10-CM | POA: Diagnosis not present

## 2020-10-21 DIAGNOSIS — N529 Male erectile dysfunction, unspecified: Secondary | ICD-10-CM | POA: Diagnosis not present

## 2020-12-01 ENCOUNTER — Ambulatory Visit: Payer: Medicare Other | Admitting: Orthopaedic Surgery

## 2021-01-14 DIAGNOSIS — H5203 Hypermetropia, bilateral: Secondary | ICD-10-CM | POA: Diagnosis not present

## 2021-01-21 ENCOUNTER — Ambulatory Visit: Payer: Self-pay

## 2021-01-21 ENCOUNTER — Other Ambulatory Visit: Payer: Self-pay

## 2021-01-21 ENCOUNTER — Ambulatory Visit: Payer: Medicare HMO | Admitting: Orthopaedic Surgery

## 2021-01-21 ENCOUNTER — Encounter: Payer: Self-pay | Admitting: Surgery

## 2021-01-21 VITALS — BP 184/83 | HR 73 | Temp 98.0°F | Ht 69.5 in | Wt 205.0 lb

## 2021-01-21 DIAGNOSIS — M25562 Pain in left knee: Secondary | ICD-10-CM

## 2021-01-21 DIAGNOSIS — M71162 Other infective bursitis, left knee: Secondary | ICD-10-CM | POA: Insufficient documentation

## 2021-01-21 MED ORDER — CEPHALEXIN 500 MG PO CAPS: 500.0000 mg | ORAL_CAPSULE | Freq: Four times a day (QID) | ORAL | 0 refills | Status: DC

## 2021-01-21 NOTE — Progress Notes (Addendum)
Office Visit Note   Patient: Aaron Duarte           Date of Birth: 01-26-1951           MRN: [id number] Visit Date: 01/21/2021              Requested by: Merrilee Seashore, [address] [place],  [place] [address] PCP: Merrilee Seashore, MD   Assessment & Plan: Visit Diagnoses:  1. Acute pain of left knee   2. Infected prepatellar bursa, left     Plan: Aaron Duarte was seen by Jeneen Rinks this afternoon for evaluation of painful full prepatellar region of left knee.  He aspirated what appeared to be an infected prepatellar bursa and sent the fluid off for culture and sensitivity.  He also prescribed Keflex 500 mg 4 times a day.  I evaluated Aaron Duarte and performed an I&D of the prepatellar bursa.  There was no purulence but about 5 to 7 cc of bloody fluid.  I irrigated the wound and then packed it with iodoform gauze and applied a knee immobilizer after a bulky sterile dressing.  We will follow-up with Dr.Xu in the morning and start taking Keflex 500 mg 4 times a day.  There was no evidence of a sending lymphangitis or thigh discomfort.  Patient has been taking aspirin 3 times a week.  Follow-Up Instructions: Return Tomorrow to see Dr.Xu for follow-up of I&D prepatellar bursa left knee.   Orders:  Orders Placed This Encounter  Procedures   Gram stain   Anaerobic and Aerobic Culture   XR KNEE 3 VIEW LEFT   CBC with Differential   Comprehensive Metabolic Panel (CMET)   Cell count + diff,  w/ cryst-synvl fld   Meds ordered this encounter  Medications   cephALEXin (KEFLEX) 500 MG capsule    Sig: Take 1 capsule (500 mg total) by mouth 4 (four) times daily.    Dispense:  40 capsule    Refill:  0      Procedures: Incision & Drainage  Date/Time: 01/21/2021 4:50 PM Performed by: Garald Balding, MD Authorized by: Garald Balding, MD   Consent:    Consent obtained:  Verbal   Consent given by:  Patient   Risks, benefits, and alternatives were discussed: yes      Risks discussed:  Incomplete drainage Location:    Type:  Bursa   Location:  Lower extremity   Lower extremity location:  Knee   Knee location:  L knee Pre-procedure details:    Skin preparation:  Alcohol and povidone-iodine Sedation:    Sedation type:  None Anesthesia:    Anesthesia method:  Local infiltration   Local anesthetic:  Lidocaine 1% w/o epi Procedure type:    Complexity:  Simple Procedure details:    Incision types:  Single straight   Incision depth:  Subfascial   Wound management:  Probed and deloculated   Drainage:  Bloody   Drainage amount:  Moderate   Packing materials:  1/2 in iodoform gauze   Amount 1/2" iodoform:  6 inches Post-procedure details:    Procedure completion:  Tolerated well, no immediate complications Comments:     Placed in knee immobilizer.  Will follow-up with Dr.Xu in the a.m.   Clinical Data: No additional findings.   Subjective: Chief Complaint  Patient presents with   Left Knee - Pain  Aaron Duarte was seen by Jeneen Rinks in the clinic this afternoon who thought that he might have an infected  left knee prepatellar bursa.  He aspirated fluid and sent to the lab for culture and sensitivity and placed him on Keflex 500 mg 4 times a day.  He asked me to evaluate him.  I performed an I&D of the prepatellar bursa.  There was no purulence but about 5 to 7 cc of blood.  Aaron Duarte is diabetic and is on aspirin 3 times a week.  He has not had any fever or chills within the last several days but thought this past weekend he may have been a little chilled.  Otherwise he feels fine  HPI  Review of Systems   Objective: Vital Signs: BP (!) 184/83   Pulse 73   Temp 98 F (36.7 C)   Ht 5' 9.5" (1.765 m)   Wt 205 lb (93 kg)   BMI 29.84 kg/m   Physical Exam Constitutional:      Appearance: He is well-developed.  Pulmonary:     Effort: Pulmonary effort is normal.  Skin:    General: Skin is warm and dry.  Neurological:     Mental Status: He is  alert and oriented to person, place, and time.  Psychiatric:        Behavior: Behavior normal.    Ortho Exam awake alert and oriented x3 comfortable sitting in no acute distress.  Left knee exam demonstrated an area of swelling and mild tenderness in the prepatellar region.  There was a little bit of fluctuance.  No knee effusion or joint pain.  Thigh was soft and nontender no evidence of an ascending lymphangitis.  Specialty Comments:  No specialty comments available.  Imaging: XR KNEE 3 VIEW LEFT  Result Date: 01/21/2021 Films of the left knee were obtained in 3 projections.  There is mild decrease in the medial joint space with about 1 degree of varus.  There is prepatellar swelling in the soft tissue.  Patella appears to be intact no obvious knee effusion    PMFS History: Patient Active Problem List   Diagnosis Date Noted   Infected prepatellar bursa, left 01/21/2021   Multiple rib fractures 01/21/2020   Closed left scapular fracture 01/21/2020   Hypertension 04/05/2019   Abnormal renal ultrasound 04/05/2019   Abnormal electrocardiogram (ECG) (EKG) 03/24/2019   Hypertensive heart disease 03/24/2019   Carotid artery calcification 03/24/2019   Past Medical History:  Diagnosis Date   Abnormal electrocardiogram (ECG) (EKG) 03/24/2019   Abnormal renal ultrasound 04/05/2019   Bulging lumbar disc    Carotid artery calcification 03/24/2019   Diabetes mellitus without complication [facility])    ED (erectile dysfunction)    Hyperlipidemia    Hypertension    Hypertensive heart disease 03/24/2019   Hypogonadism male    Umbilical hernia     Family History  Problem Relation Age of Onset   Heart failure Mother    Hypertension Mother    Kidney disease Father    Liver cancer Father    Cancer Sister    Breast cancer Sister     Past Surgical History:  Procedure Laterality Date   APPENDECTOMY     Social History   Occupational History   Not on file  Tobacco Use   Smoking status: Never    Smokeless tobacco: Never  Vaping Use   Vaping Use: Never used  Substance and Sexual Activity   Alcohol use: Not on file    Comment: per week    Drug use: Never   Sexual activity: Yes     Aaron Kotyk  Durward Fortes, MD   Note - This record has been created using Editor, commissioning.  Chart creation errors have been sought, but may not always  have been located. Such creation errors do not reflect on  the standard of medical care.     Aaron Riding PA-C June 9,2022 I had also seen patient in the clinic before sending him up to Dr. Durward Duarte.  Stated on Memorial Day weekend he had been crawling around on concrete doing some work.  Since that time is it off and on swelling of his prepatellar bursa and also lower leg.  Sunday states that he cannot stop shivering and felt very cold the next day.  Says he felt like he had the flu.  Cannot find a tick bite etc.  Over the last couple days he has not had any pain but continues to have the swelling and redness of the anterior knee.  Procedure After patient consent anterior knee was prepped with Betadine. I used 2 cc of Xylocaine for local anesthetic and then aspirated about 10 to 12 cc of purulent fluid from the prepatellar bursa.  I spoke with Dr. Durward Duarte who is on-call and patient was taken up to see him.  Purulent fluid was sent to the lab for cell count with differential, crystals, gram stain and cultures.  Also did blood work to check CBC with differential and c-Met.

## 2021-01-22 ENCOUNTER — Ambulatory Visit: Payer: Medicare HMO | Admitting: Orthopaedic Surgery

## 2021-01-22 ENCOUNTER — Encounter: Payer: Self-pay | Admitting: Orthopaedic Surgery

## 2021-01-22 DIAGNOSIS — M71162 Other infective bursitis, left knee: Secondary | ICD-10-CM

## 2021-01-22 LAB — COMPREHENSIVE METABOLIC PANEL
AG Ratio: 1.7 (calc) (ref 1.0–2.5)
ALT: 17 U/L (ref 9–46)
AST: 14 U/L (ref 10–35)
Albumin: 4.3 g/dL (ref 3.6–5.1)
Alkaline phosphatase (APISO): 43 U/L (ref 35–144)
BUN: 23 mg/dL (ref 7–25)
CO2: 29 mmol/L (ref 20–32)
Calcium: 10 mg/dL (ref 8.6–10.3)
Chloride: 101 mmol/L (ref 98–110)
Creat: 0.91 mg/dL (ref 0.70–1.18)
Globulin: 2.5 g/dL (calc) (ref 1.9–3.7)
Glucose, Bld: 175 mg/dL — ABNORMAL HIGH (ref 65–99)
Potassium: 4.6 mmol/L (ref 3.5–5.3)
Sodium: 140 mmol/L (ref 135–146)
Total Bilirubin: 0.4 mg/dL (ref 0.2–1.2)
Total Protein: 6.8 g/dL (ref 6.1–8.1)

## 2021-01-22 LAB — CBC WITH DIFFERENTIAL/PLATELET
Absolute Monocytes: 935 cells/uL (ref 200–950)
Basophils Absolute: 36 cells/uL (ref 0–200)
Basophils Relative: 0.4 %
Eosinophils Absolute: 160 cells/uL (ref 15–500)
Eosinophils Relative: 1.8 %
HCT: 40.9 % (ref 38.5–50.0)
Hemoglobin: 13.8 g/dL (ref 13.2–17.1)
Lymphs Abs: 1798 cells/uL (ref 850–3900)
MCH: 29.6 pg (ref 27.0–33.0)
MCHC: 33.7 g/dL (ref 32.0–36.0)
MCV: 87.8 fL (ref 80.0–100.0)
MPV: 10.7 fL (ref 7.5–12.5)
Monocytes Relative: 10.5 %
Neutro Abs: 5972 cells/uL (ref 1500–7800)
Neutrophils Relative %: 67.1 %
Platelets: 269 10*3/uL (ref 140–400)
RBC: 4.66 10*6/uL (ref 4.20–5.80)
RDW: 12.5 % (ref 11.0–15.0)
Total Lymphocyte: 20.2 %
WBC: 8.9 10*3/uL (ref 3.8–10.8)

## 2021-01-22 NOTE — Progress Notes (Signed)
Patient ID: Aaron Duarte, male   DOB: 15-Feb-1951, 70 y.o.   MRN: [id number]  [name] comes in today for recheck of left knee prepatellar bursitis status post incision and drainage by Dr. Durward Fortes.  He states that he is feeling much better and the swelling of his leg has greatly improved.  Denies any constitutional symptoms.  The bandage has some dried blood but there is no evidence of purulent drainage or worsening infection.  There is some local erythema around the prepatellar bursa.  Gentle range of motion is painless.  At this point we will have him continue the Keflex and the iodoform packing which will likely be pulled next week when he sees Dr. Durward Fortes.  Dry dressing was applied today.  Patient was instructed on signs or symptoms to look out for that may indicate clinical worsening.

## 2021-01-25 ENCOUNTER — Telehealth: Payer: Self-pay

## 2021-01-25 DIAGNOSIS — E291 Testicular hypofunction: Secondary | ICD-10-CM | POA: Insufficient documentation

## 2021-01-25 DIAGNOSIS — E119 Type 2 diabetes mellitus without complications: Secondary | ICD-10-CM | POA: Insufficient documentation

## 2021-01-25 DIAGNOSIS — N529 Male erectile dysfunction, unspecified: Secondary | ICD-10-CM | POA: Insufficient documentation

## 2021-01-25 DIAGNOSIS — E785 Hyperlipidemia, unspecified: Secondary | ICD-10-CM | POA: Insufficient documentation

## 2021-01-25 DIAGNOSIS — M5136 Other intervertebral disc degeneration, lumbar region: Secondary | ICD-10-CM | POA: Insufficient documentation

## 2021-01-25 DIAGNOSIS — M51369 Other intervertebral disc degeneration, lumbar region without mention of lumbar back pain or lower extremity pain: Secondary | ICD-10-CM | POA: Insufficient documentation

## 2021-01-25 DIAGNOSIS — K429 Umbilical hernia without obstruction or gangrene: Secondary | ICD-10-CM | POA: Insufficient documentation

## 2021-01-25 DIAGNOSIS — M5126 Other intervertebral disc displacement, lumbar region: Secondary | ICD-10-CM | POA: Insufficient documentation

## 2021-01-25 NOTE — Telephone Encounter (Signed)
I cannot find the culture report from [place] under "results"

## 2021-01-25 NOTE — Telephone Encounter (Signed)
Patient has critical lab results per [facility].  Please advise.  Thank you

## 2021-01-27 LAB — SYNOVIAL FLUID ANALYSIS, COMPLETE
Basophils, %: 0 %
Eosinophils-Synovial: 0 % (ref 0–2)
Lymphocytes-Synovial Fld: 4 % (ref 0–74)
Monocyte/Macrophage: 2 % (ref 0–69)
Neutrophil, Synovial: 94 % — ABNORMAL HIGH (ref 0–24)
Synoviocytes, %: 0 % (ref 0–15)

## 2021-01-27 LAB — ANAEROBIC AND AEROBIC CULTURE
MICRO NUMBER:: 11988761
MICRO NUMBER:: 11988762
SPECIMEN QUALITY:: ADEQUATE
SPECIMEN QUALITY:: ADEQUATE

## 2021-01-28 ENCOUNTER — Other Ambulatory Visit: Payer: Self-pay

## 2021-01-28 ENCOUNTER — Encounter: Payer: Self-pay | Admitting: Orthopaedic Surgery

## 2021-01-28 ENCOUNTER — Ambulatory Visit: Payer: Medicare HMO | Admitting: Orthopaedic Surgery

## 2021-01-28 VITALS — Ht 69.5 in | Wt 205.0 lb

## 2021-01-28 DIAGNOSIS — M71162 Other infective bursitis, left knee: Secondary | ICD-10-CM

## 2021-01-28 NOTE — Progress Notes (Signed)
Office Visit Note   Patient: Aaron Duarte           Date of Birth: [birth year]           MRN: [id number] Visit Date: 01/28/2021              Requested by: Aaron Duarte, [address] [place],  Aaron Du[place] [id number] PCP: Aaron Seashore, MD   Assessment & Plan: Visit Diagnoses:  1. Infected prepatellar bursa, left     Plan: 1 week status post I&D of her prepatellar infected bursa left knee.  Doing well.  Culture showed a few staph aureus.  He has not had any fever or chills and notes that his knee is no longer swollen or painful he is walking without ambulatory aid.  I remove the remainder of the iodoform packing and there is no purulence.  The skin is pink and not red and there is no pain.  He had no swelling of his leg.  Applied a Band-Aid he is aware that he will have some drainage until the the I&D site fills and then like to see him back in a week.  He can stop the antibiotics  Follow-Up Instructions: Return in about 1 week (around 02/04/2021).   Orders:  No orders of the defined types were placed in this encounter.  No orders of the defined types were placed in this encounter.     Procedures: No procedures performed   Clinical Data: No additional findings.   Subjective: Chief Complaint  Patient presents with   Left Knee - Pain, Follow-up  Patient presents today for follow up on his left knee. He had an I&D of his prepatella bursa on 01/21/2021. He has been taking Keflex. Patient states that he is doing well and still on antibiotics. He has no complaints today.   HPI  Review of Systems   Objective: Vital Signs: Ht 5' 9.5" (1.765 m)   Wt 205 lb (93 kg)   BMI 29.84 kg/m   Physical Exam Constitutional:      Appearance: He is well-developed.  Eyes:     Pupils: Pupils are equal, round, and reactive to light.  Pulmonary:     Effort: Pulmonary effort is normal.  Skin:    General: Skin is warm and dry.  Neurological:     Mental Status: He  is alert and oriented to person, place, and time.  Psychiatric:        Behavior: Behavior normal.    Ortho Exam left knee was not hot red warm or swollen.  There is no purulence from the I&D site which is probably 3/8 of an inch in transverse diameter over the prepatellar region of the left knee.  The leg was not swollen or painful.  No ascending lymphangitis and full range of motion of the knee with no pain.  No fluctuance  Specialty Comments:  No specialty comments available.  Imaging: No results found.   PMFS History: Patient Active Problem List   Diagnosis Date Noted   Bulging lumbar disc 01/25/2021   Diabetes mellitus without complication ([facility]) 38/18/2993   ED (erectile dysfunction) 01/25/2021   Hyperlipidemia 01/25/2021   Hypogonadism male 71/69/6789   Umbilical hernia 38/05/1750   Infected prepatellar bursa, left 01/21/2021   Multiple rib fractures 01/21/2020   Closed left scapular fracture 01/21/2020   Hypertension 04/05/2019   Abnormal renal ultrasound 04/05/2019   Abnormal electrocardiogram (ECG) (EKG) 03/24/2019   Hypertensive heart disease 03/24/2019   Carotid artery  calcification 03/24/2019   Past Medical History:  Diagnosis Date   Abnormal electrocardiogram (ECG) (EKG) 03/24/2019   Abnormal renal ultrasound 04/05/2019   Bulging lumbar disc    Carotid artery calcification 03/24/2019   Diabetes mellitus without complication [facility])    ED (erectile dysfunction)    Hyperlipidemia    Hypertension    Hypertensive heart disease 03/24/2019   Hypogonadism male    Umbilical hernia     Family History  Problem Relation Age of Onset   Heart failure Mother    Hypertension Mother    Kidney disease Father    Liver cancer Father    Cancer Sister    Breast cancer Sister     Past Surgical History:  Procedure Laterality Date   APPENDECTOMY     Social History   Occupational History   Not on file  Tobacco Use   Smoking status: Never   Smokeless tobacco: Never  Vaping  Use   Vaping Use: Never used  Substance and Sexual Activity   Alcohol use: Not on file    Comment: per week    Drug use: Never   Sexual activity: Yes

## 2021-02-03 ENCOUNTER — Encounter: Payer: Self-pay | Admitting: Orthopaedic Surgery

## 2021-02-03 ENCOUNTER — Other Ambulatory Visit: Payer: Self-pay

## 2021-02-03 ENCOUNTER — Ambulatory Visit: Payer: Medicare HMO | Admitting: Orthopaedic Surgery

## 2021-02-03 VITALS — Ht 69.5 in | Wt 205.0 lb

## 2021-02-03 DIAGNOSIS — M71162 Other infective bursitis, left knee: Secondary | ICD-10-CM

## 2021-02-03 NOTE — Progress Notes (Signed)
Office Visit Note   Patient: Aaron Duarte           Date of Birth: 10/28/1950           MRN: [id number] Visit Date: 02/03/2021              Requested by: Aaron Duarte, [address],  [place] [id number] PCP: Aaron Seashore, MD   Assessment & Plan: Visit Diagnoses:  1. Infected prepatellar bursa, left     Plan: Aaron Duarte is been followed for the infected prepatellar bursa of the right knee.  He is nearly 2 weeks status post I&D in the office subsequent cultures revealed diffuse staph species.  He was placed on a 5-day course of Keflex.  Subsequent cultures were not sensitive to the Keflex but he has done exceptionally well and there is no evidence of any remaining infection.  I remove the iodoform gauze wicks last week and asked him to return today.  He does not have any pain in the bursa is pretty much resolved there is no drainage or redness or any pain.  He is diabetic and there is always a risk of this returning he is aware that he should give [place] a call should he have any further symptoms  Follow-Up Instructions: Return if symptoms worsen or fail to improve.   Orders:  No orders of the defined types were placed in this encounter.  No orders of the defined types were placed in this encounter.     Procedures: No procedures performed   Clinical Data: No additional findings.   Subjective: Chief Complaint  Patient presents with   Left Knee - Follow-up  Patient presents today for a one week follow up on his left knee. He is now two weeks out from left knee I&D.  Patient states that he is doing well.   HPI  Review of Systems   Objective: Vital Signs: Ht 5' 9.5" (1.765 m)   Wt 205 lb (93 kg)   BMI 29.84 kg/m   Physical Exam Constitutional:      Appearance: He is well-developed.  Eyes:     Pupils: Pupils are equal, round, and reactive to light.  Pulmonary:     Effort: Pulmonary effort is normal.  Skin:    General: Skin is warm  and dry.  Neurological:     Mental Status: He is alert and oriented to person, place, and time.  Psychiatric:        Behavior: Behavior normal.    Ortho Exam left knee with a small incisional area over the prepatellar bursa healed there is no drainage.  No redness or pain or any fluctuance.  No knee pain or effusion  Specialty Comments:  No specialty comments available.  Imaging: No results found.   PMFS History: Patient Active Problem List   Diagnosis Date Noted   Bulging lumbar disc 01/25/2021   Diabetes mellitus without complication ([place]) 64/40/3474   ED (erectile dysfunction) 01/25/2021   Hyperlipidemia 01/25/2021   Hypogonadism male 25/95/6387   Umbilical hernia 56/43/3295   Infected prepatellar bursa, left 01/21/2021   Multiple rib fractures 01/21/2020   Closed left scapular fracture 01/21/2020   Hypertension 04/05/2019   Abnormal renal ultrasound 04/05/2019   Abnormal electrocardiogram (ECG) (EKG) 03/24/2019   Hypertensive heart disease 03/24/2019   Carotid artery calcification 03/24/2019   Past Medical History:  Diagnosis Date   Abnormal electrocardiogram (ECG) (EKG) 03/24/2019   Abnormal renal ultrasound 04/05/2019   Bulging lumbar  disc    Carotid artery calcification 03/24/2019   Diabetes mellitus without complication [facility])    ED (erectile dysfunction)    Hyperlipidemia    Hypertension    Hypertensive heart disease 03/24/2019   Hypogonadism male    Umbilical hernia     Family History  Problem Relation Age of Onset   Heart failure Mother    Hypertension Mother    Kidney disease Father    Liver cancer Father    Cancer Sister    Breast cancer Sister     Past Surgical History:  Procedure Laterality Date   APPENDECTOMY     Social History   Occupational History   Not on file  Tobacco Use   Smoking status: Never   Smokeless tobacco: Never  Vaping Use   Vaping Use: Never used  Substance and Sexual Activity   Alcohol use: Not on file    Comment: per  week    Drug use: Never   Sexual activity: Yes

## 2021-02-09 ENCOUNTER — Encounter: Payer: Self-pay | Admitting: Cardiology

## 2021-02-09 ENCOUNTER — Other Ambulatory Visit: Payer: Self-pay

## 2021-02-09 ENCOUNTER — Ambulatory Visit: Payer: Medicare Other | Admitting: Cardiology

## 2021-02-09 VITALS — BP 178/80 | HR 69 | Ht 69.5 in | Wt 213.8 lb

## 2021-02-09 DIAGNOSIS — E119 Type 2 diabetes mellitus without complications: Secondary | ICD-10-CM | POA: Diagnosis not present

## 2021-02-09 DIAGNOSIS — R9431 Abnormal electrocardiogram [ECG] [EKG]: Secondary | ICD-10-CM

## 2021-02-09 DIAGNOSIS — I10 Essential (primary) hypertension: Secondary | ICD-10-CM

## 2021-02-09 DIAGNOSIS — E782 Mixed hyperlipidemia: Secondary | ICD-10-CM | POA: Diagnosis not present

## 2021-02-09 NOTE — Patient Instructions (Addendum)
Medication Instructions:  Your physician recommends that you continue on your current medications as directed. Please refer to the Current Medication list given to you today.  *If you need a refill on your cardiac medications before your next appointment, please call your pharmacy*   Lab Work: None If you have labs (blood work) drawn today and your tests are completely normal, you will receive your results only by: [place] (if you have MyChart) OR A paper copy in the mail If you have any lab test that is abnormal or we need to change your treatment, we will call you to review the results.   Testing/Procedures: None   Follow-Up: At [facility], you and your health needs are our priority.  As part of our continuing mission to provide you with exceptional heart care, we have created designated Provider Care Teams.  These Care Teams include your primary Cardiologist (physician) and Advanced Practice Providers (APPs -  Physician Assistants and Nurse Practitioners) who all work together to provide you with the care you need, when you need it.  We recommend signing up for the patient portal called "MyChart".  Sign up information is provided on this After Visit Summary.  MyChart is used to connect with patients for Virtual Visits (Telemedicine).  Patients are able to view lab/test results, encounter notes, upcoming appointments, etc.  Non-urgent messages can be sent to your provider as well.   To learn more about what you can do with MyChart, go to [email or web address].    Your next appointment:   1 year(s)  The format for your next appointment:   In Person  Provider:   Shirlee More, MD   Other Instructions  Tips to measure your blood pressure correctly  To determine whether you have hypertension, a medical professional will take a blood pressure reading. How you prepare for the test, the position of your arm, and other factors can change a blood pressure reading by 10% or  more. That could be enough to hide high blood pressure, start you on a drug you don't really need, or lead your doctor to incorrectly adjust your medications. National and international guidelines offer specific instructions for measuring blood pressure. If a doctor, nurse, or medical assistant isn't doing it right, don't hesitate to ask him or her to get with the guidelines. Here's what you can do to ensure a correct reading:  Don't drink a caffeinated beverage or smoke during the 30 minutes before the test.  Sit quietly for five minutes before the test begins.  During the measurement, sit in a chair with your feet on the floor and your arm supported so your elbow is at about heart level.  The inflatable part of the cuff should completely cover at least 80% of your upper arm, and the cuff should be placed on bare skin, not over a shirt.  Don't talk during the measurement.  Have your blood pressure measured twice, with a brief break in between. If the readings are different by 5 points or more, have it done a third time. There are times to break these rules. If you sometimes feel lightheaded when getting out of bed in the morning or when you stand after sitting, you should have your blood pressure checked while seated and then while standing to see if it falls from one position to the next. Because blood pressure varies throughout the day, your doctor will rarely diagnose hypertension on the basis of a single reading. Instead, he or she will want  to confirm the measurements on at least two occasions, usually within a few weeks of one another. The exception to this rule is if you have a blood pressure reading of 180/110 mm Hg or higher. A result this high usually calls for prompt treatment. It's also a good idea to have your blood pressure measured in both arms at least once, since the reading in one arm (usually the right) may be higher than that in the left. A 2014 study in The American Journal of  Medicine of nearly 3,400 people found average arm- to-arm differences in systolic blood pressure of about 5 points. The higher number should be used to make treatment decisions. In 2017, new guidelines from the [place], the [employer] of Cardiology, and nine other health organizations lowered the diagnosis of high blood pressure to 130/80 mm Hg or higher for all adults. The guidelines also redefined the various blood pressure categories to now include normal, elevated, Stage 1 hypertension, Stage 2 hypertension, and hypertensive crisis (see "Blood pressure categories"). Blood pressure categories  Blood pressure category SYSTOLIC (upper number)  DIASTOLIC (lower number)  Normal Less than 120 mm Hg and Less than 80 mm Hg  Elevated 120-129 mm Hg and Less than 80 mm Hg  High blood pressure: Stage 1 hypertension 130-139 mm Hg or 80-89 mm Hg  High blood pressure: Stage 2 hypertension 140 mm Hg or higher or 90 mm Hg or higher  Hypertensive crisis (consult your doctor immediately) Higher than 180 mm Hg and/or Higher than 120 mm Hg  Source: American Heart Association and American Stroke Association. For more on getting your blood pressure under control, buy Controlling Your Blood Pressure, a Special Health Report from [facility].

## 2021-02-09 NOTE — Progress Notes (Signed)
Cardiology Office Note:    Date:  02/09/2021   ID:  Aaron Duarte, DOB 1950-10-11, MRN [id number]  PCP:  Merrilee Seashore, MD  Cardiologist:  Shirlee More, MD    Referring MD: Merrilee Seashore, MD    ASSESSMENT:    1. Essential hypertension   2. Type 2 diabetes mellitus without complication, without long-term current use of insulin ([place])   3. Mixed hyperlipidemia   4. Abnormal electrocardiogram (ECG) (EKG)    PLAN:    In order of problems listed above:  Fortunately checks his blood pressure at home good technique reliable device is always in range and he will continue his current medical rep therapy including calcium channel blocker beta-blocker loop diuretic and ARB.  Labs for renal function potassium are followed with his PCP A1c remains above target managed by his PCP he is on sustained-release metformin SGLT2 inhibitor. Lipids are at target with vascular calcification continue his high intensity statin Stable EKG pattern   Next appointment: 1 year   Medication Adjustments/Labs and Tests Ordered: Current medicines are reviewed at length with the patient today.  Concerns regarding medicines are outlined above.  No orders of the defined types were placed in this encounter.  No orders of the defined types were placed in this encounter.   Chief Complaint  Patient presents with   Follow-up   Hypertension    History of Present Illness:    Aaron Duarte is a 70 y.o. male with a hx of hypertension type 2 diabetes and hyperlipidemia last seen 12/11/2019.  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  Compliance with diet, lifestyle and medications: Yes  He has very good health habits he goes to the gym every day checks his blood  pressure between 430 and 6 in the morning and without exception always in the range of 1 20-1 35 over the low 70s. He has had no exercise intolerance edema shortness of breath chest pain palpitation or syncope. 's office EKG shows the same pattern of nonspecific T waves in the inferior leads. Recent labs with his primary care physician shows an A1c elevated 8.2% lipids at target cholesterol 139 LDL 87 triglycerides 85 HDL 35 creatinine 0.91 TSH normal 1.69 Past Medical History:  Diagnosis Date   Abnormal electrocardiogram (ECG) (EKG) 03/24/2019   Abnormal renal ultrasound 04/05/2019   Bulging lumbar disc    Carotid artery calcification 03/24/2019   Diabetes mellitus without complication [facility])    ED (erectile dysfunction)    Hyperlipidemia    Hypertension    Hypertensive heart disease 03/24/2019   Hypogonadism male    Umbilical hernia     Past Surgical History:  Procedure Laterality Date   APPENDECTOMY      Current Medications: Current Meds  Medication Sig   amLODipine (NORVASC) 10 MG tablet TAKE 1 TABLET BY MOUTH EVERY DAY   aspirin EC 81 MG tablet Take 81 mg by mouth 3 (three) times a week.   atorvastatin (LIPITOR) 40 MG tablet Take 40 mg by mouth daily.   carvedilol (COREG) 25 MG tablet TAKE 1 TABLET BY MOUTH TWICE A DAY   cephALEXin (KEFLEX) 500 MG capsule Take 1 capsule (500 mg total) by mouth 4 (four) times daily.   Echinacea Herb 400 MG CAPS Take 1 capsule by mouth daily.   FARXIGA 10  MG TABS tablet Take 10 mg by mouth daily.   furosemide (LASIX) 20 MG tablet TAKE 1 TABLET BY MOUTH EVERY DAY   Glucosamine Sulfate 1000 MG TABS Take 1 tablet by mouth daily.   ibuprofen (ADVIL) 600 MG tablet    metFORMIN (GLUCOPHAGE-XR) 500 MG 24 hr tablet Take 1,000 mg by mouth 2 (two) times daily.   Misc Natural Products (GINSENG COMPLEX PO) Take 1 tablet by mouth daily.   MODAFINIL PO Take 1 tablet by mouth daily as needed.   Multiple Vitamins-Minerals (CENTRUM SILVER PO) Take 1 tablet by mouth  daily.   sildenafil (REVATIO) 20 MG tablet Take 20 mg by mouth daily as needed.   telmisartan (MICARDIS) 40 MG tablet TAKE 1 TABLET BY MOUTH EVERY DAY   Vitamin D, Ergocalciferol, (DRISDOL) 1.25 MG (50000 UT) CAPS capsule Take 50,000 Units by mouth once a week.   vitamin E 1000 UNIT capsule Take 1,000 Units by mouth daily.     Allergies:   Patient has no known allergies.   Social History   Socioeconomic History   Marital status: Single    Spouse name: Not on file   Number of children: Not on file   Years of education: Not on file   Highest education level: Not on file  Occupational History   Not on file  Tobacco Use   Smoking status: Never   Smokeless tobacco: Never  Vaping Use   Vaping Use: Never used  Substance and Sexual Activity   Alcohol use: Not on file    Comment: per week    Drug use: Never   Sexual activity: Yes  Other Topics Concern   Not on file  Social History Narrative   Not on file   Social Determinants of Health   Financial Resource Strain: Not on file  Food Insecurity: Not on file  Transportation Needs: Not on file  Physical Activity: Not on file  Stress: Not on file  Social Connections: Not on file     Family History: The patient's family history includes Breast cancer in his sister; Cancer in his sister; Heart failure in his mother; Hypertension in his mother; Kidney disease in his father; Liver cancer in his father. ROS:   Please see the history of present illness.    All other systems reviewed and are negative.  EKGs/Labs/Other Studies Reviewed:    The following studies were reviewed today:  EKG:  EKG ordered today and personally reviewed.  The ekg ordered today demonstrates sinus rhythm nonspecific T waves unchanged    Physical Exam:    VS:  BP (!) 178/80 (BP Location: Right Arm, Patient Position: Sitting)   Pulse 69   Ht 5' 9.5" (1.765 m)   Wt 213 lb 12.8 oz (97 kg)   SpO2 95%   BMI 31.12 kg/m     Wt Readings from Last 3  Encounters:  02/09/21 213 lb 12.8 oz (97 kg)  02/03/21 205 lb (93 kg)  01/28/21 205 lb (93 kg)     GEN:  Well nourished, well developed in no acute distress HEENT: Normal NECK: No JVD; No carotid bruits LYMPHATICS: No lymphadenopathy CARDIAC: RRR, no murmurs, rubs, gallops RESPIRATORY:  Clear to auscultation without rales, wheezing or rhonchi  ABDOMEN: Soft, non-tender, non-distended MUSCULOSKELETAL:  No edema; No deformity  SKIN: Warm and dry NEUROLOGIC:  Alert and oriented x 3 PSYCHIATRIC:  Normal affect    Signed, Shirlee More, MD  02/09/2021 8:42 AM    [facility]

## 2021-02-28 IMAGING — CR ORBITS FOR FOREIGN BODY - 2 VIEW
2 series · 2 of 2 positions shown · non-contrast
Comparison: None.

CLINICAL DATA: Metal working/exposure; clearance prior to MRI

EXAM:
ORBITS FOR FOREIGN BODY - 2 VIEW

[w orbit pa (1 of 2)]
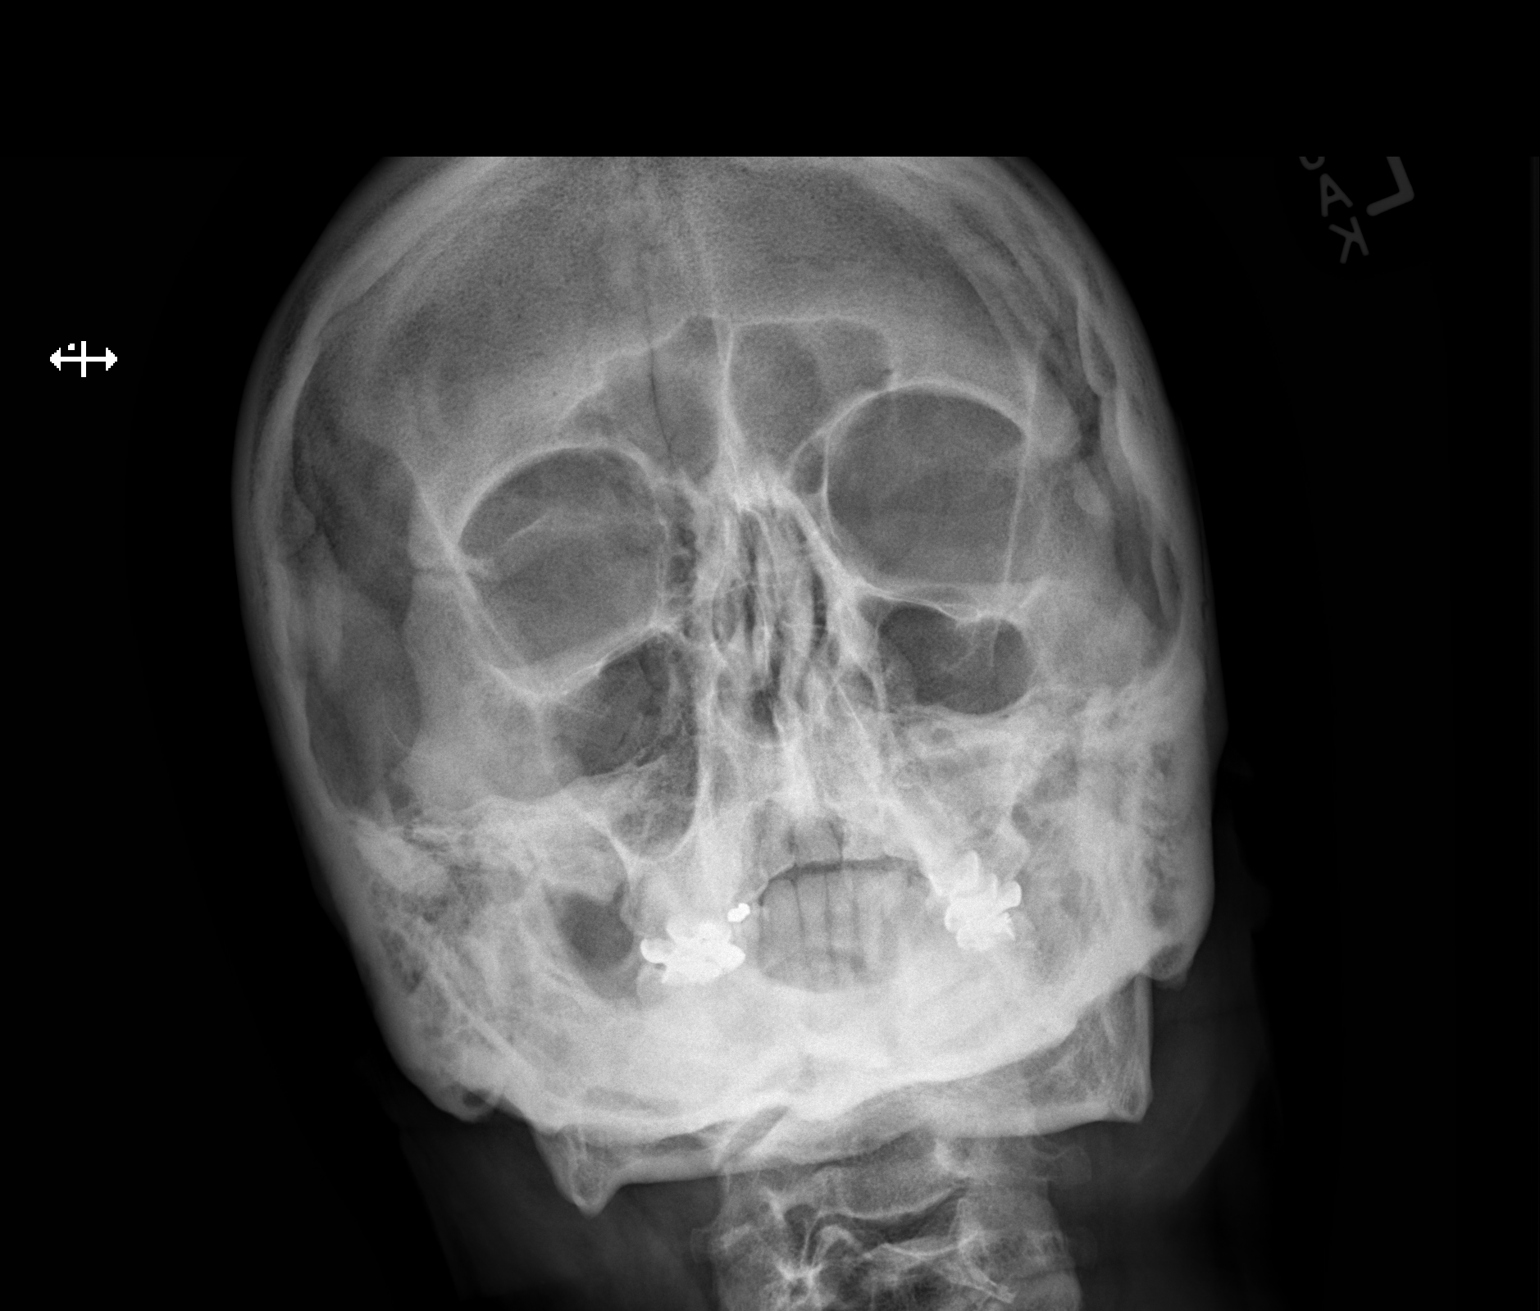

[w orbit pa (2 of 2)]
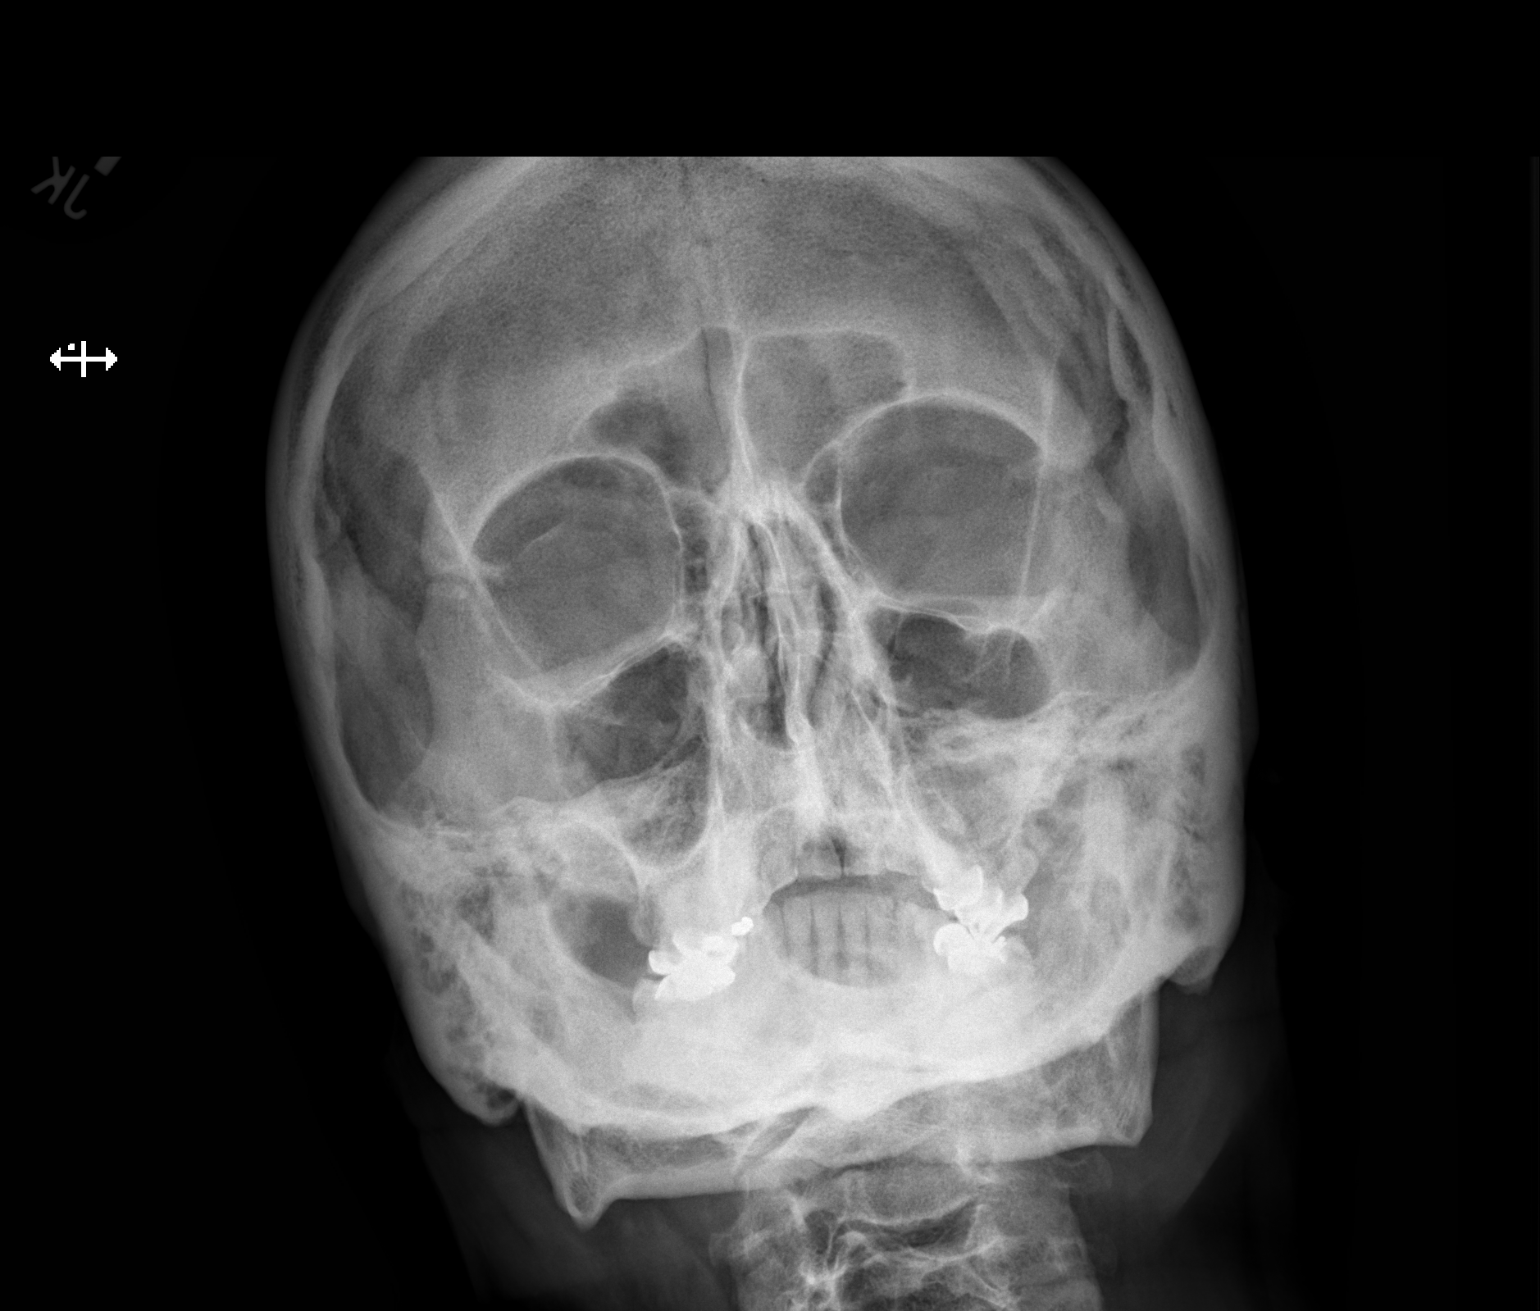

[2 of 2 positions shown; findings below may reference images not displayed]

FINDINGS: Water's views with eyes deviated toward the left and toward the
right were obtained. No intraorbital radiopaque foreign body.
Paranasal sinuses clear. No fracture or dislocation.
IMPRESSION: No evidence of metallic foreign body within the orbits.

## 2021-03-05 DIAGNOSIS — H25811 Combined forms of age-related cataract, right eye: Secondary | ICD-10-CM | POA: Diagnosis not present

## 2021-03-15 DIAGNOSIS — Z01818 Encounter for other preprocedural examination: Secondary | ICD-10-CM | POA: Diagnosis not present

## 2021-03-15 DIAGNOSIS — I129 Hypertensive chronic kidney disease with stage 1 through stage 4 chronic kidney disease, or unspecified chronic kidney disease: Secondary | ICD-10-CM | POA: Diagnosis not present

## 2021-03-15 DIAGNOSIS — N182 Chronic kidney disease, stage 2 (mild): Secondary | ICD-10-CM | POA: Diagnosis not present

## 2021-03-15 DIAGNOSIS — E1122 Type 2 diabetes mellitus with diabetic chronic kidney disease: Secondary | ICD-10-CM | POA: Diagnosis not present

## 2021-03-24 DIAGNOSIS — E785 Hyperlipidemia, unspecified: Secondary | ICD-10-CM | POA: Diagnosis not present

## 2021-03-24 DIAGNOSIS — E1122 Type 2 diabetes mellitus with diabetic chronic kidney disease: Secondary | ICD-10-CM | POA: Diagnosis not present

## 2021-03-25 ENCOUNTER — Other Ambulatory Visit: Payer: Self-pay | Admitting: Cardiology

## 2021-03-25 DIAGNOSIS — I10 Essential (primary) hypertension: Secondary | ICD-10-CM | POA: Diagnosis not present

## 2021-03-25 DIAGNOSIS — E7800 Pure hypercholesterolemia, unspecified: Secondary | ICD-10-CM | POA: Diagnosis not present

## 2021-03-25 DIAGNOSIS — E1136 Type 2 diabetes mellitus with diabetic cataract: Secondary | ICD-10-CM | POA: Diagnosis not present

## 2021-03-25 DIAGNOSIS — H2511 Age-related nuclear cataract, right eye: Secondary | ICD-10-CM | POA: Diagnosis not present

## 2021-03-25 DIAGNOSIS — Z79899 Other long term (current) drug therapy: Secondary | ICD-10-CM | POA: Diagnosis not present

## 2021-03-25 DIAGNOSIS — H25811 Combined forms of age-related cataract, right eye: Secondary | ICD-10-CM | POA: Diagnosis not present

## 2021-03-25 DIAGNOSIS — H5789 Other specified disorders of eye and adnexa: Secondary | ICD-10-CM | POA: Diagnosis not present

## 2021-03-26 DIAGNOSIS — H25811 Combined forms of age-related cataract, right eye: Secondary | ICD-10-CM | POA: Diagnosis not present

## 2021-03-31 DIAGNOSIS — I129 Hypertensive chronic kidney disease with stage 1 through stage 4 chronic kidney disease, or unspecified chronic kidney disease: Secondary | ICD-10-CM | POA: Diagnosis not present

## 2021-03-31 DIAGNOSIS — E1165 Type 2 diabetes mellitus with hyperglycemia: Secondary | ICD-10-CM | POA: Diagnosis not present

## 2021-03-31 DIAGNOSIS — E782 Mixed hyperlipidemia: Secondary | ICD-10-CM | POA: Diagnosis not present

## 2021-03-31 DIAGNOSIS — N182 Chronic kidney disease, stage 2 (mild): Secondary | ICD-10-CM | POA: Diagnosis not present

## 2021-04-10 ENCOUNTER — Other Ambulatory Visit: Payer: Self-pay | Admitting: Cardiology

## 2021-04-22 DIAGNOSIS — I10 Essential (primary) hypertension: Secondary | ICD-10-CM | POA: Diagnosis not present

## 2021-04-22 DIAGNOSIS — E7800 Pure hypercholesterolemia, unspecified: Secondary | ICD-10-CM | POA: Diagnosis not present

## 2021-04-22 DIAGNOSIS — H25812 Combined forms of age-related cataract, left eye: Secondary | ICD-10-CM | POA: Diagnosis not present

## 2021-04-22 DIAGNOSIS — Z79899 Other long term (current) drug therapy: Secondary | ICD-10-CM | POA: Diagnosis not present

## 2021-04-22 DIAGNOSIS — H2512 Age-related nuclear cataract, left eye: Secondary | ICD-10-CM | POA: Diagnosis not present

## 2021-04-22 DIAGNOSIS — H21562 Pupillary abnormality, left eye: Secondary | ICD-10-CM | POA: Diagnosis not present

## 2021-04-22 DIAGNOSIS — E1136 Type 2 diabetes mellitus with diabetic cataract: Secondary | ICD-10-CM | POA: Diagnosis not present

## 2021-06-02 ENCOUNTER — Other Ambulatory Visit: Payer: Self-pay | Admitting: Cardiology

## 2021-06-14 DIAGNOSIS — E785 Hyperlipidemia, unspecified: Secondary | ICD-10-CM | POA: Diagnosis not present

## 2021-06-14 DIAGNOSIS — I129 Hypertensive chronic kidney disease with stage 1 through stage 4 chronic kidney disease, or unspecified chronic kidney disease: Secondary | ICD-10-CM | POA: Diagnosis not present

## 2021-06-14 DIAGNOSIS — E1165 Type 2 diabetes mellitus with hyperglycemia: Secondary | ICD-10-CM | POA: Diagnosis not present

## 2021-06-14 DIAGNOSIS — N182 Chronic kidney disease, stage 2 (mild): Secondary | ICD-10-CM | POA: Diagnosis not present

## 2021-06-23 ENCOUNTER — Other Ambulatory Visit: Payer: Self-pay | Admitting: Cardiology

## 2021-10-27 DIAGNOSIS — Z0000 Encounter for general adult medical examination without abnormal findings: Secondary | ICD-10-CM | POA: Diagnosis not present

## 2021-10-27 DIAGNOSIS — H9190 Unspecified hearing loss, unspecified ear: Secondary | ICD-10-CM | POA: Diagnosis not present

## 2021-10-27 DIAGNOSIS — E782 Mixed hyperlipidemia: Secondary | ICD-10-CM | POA: Diagnosis not present

## 2021-10-27 DIAGNOSIS — N182 Chronic kidney disease, stage 2 (mild): Secondary | ICD-10-CM | POA: Diagnosis not present

## 2021-10-27 DIAGNOSIS — Z23 Encounter for immunization: Secondary | ICD-10-CM | POA: Diagnosis not present

## 2021-10-27 DIAGNOSIS — E1165 Type 2 diabetes mellitus with hyperglycemia: Secondary | ICD-10-CM | POA: Diagnosis not present

## 2021-10-27 DIAGNOSIS — H6122 Impacted cerumen, left ear: Secondary | ICD-10-CM | POA: Diagnosis not present

## 2021-10-27 DIAGNOSIS — I129 Hypertensive chronic kidney disease with stage 1 through stage 4 chronic kidney disease, or unspecified chronic kidney disease: Secondary | ICD-10-CM | POA: Diagnosis not present

## 2021-10-27 DIAGNOSIS — R5383 Other fatigue: Secondary | ICD-10-CM | POA: Diagnosis not present

## 2021-11-30 ENCOUNTER — Other Ambulatory Visit: Payer: Self-pay | Admitting: Cardiology

## 2021-12-15 DIAGNOSIS — Z0000 Encounter for general adult medical examination without abnormal findings: Secondary | ICD-10-CM | POA: Diagnosis not present

## 2021-12-15 DIAGNOSIS — E782 Mixed hyperlipidemia: Secondary | ICD-10-CM | POA: Diagnosis not present

## 2021-12-15 DIAGNOSIS — E1121 Type 2 diabetes mellitus with diabetic nephropathy: Secondary | ICD-10-CM | POA: Diagnosis not present

## 2021-12-15 DIAGNOSIS — E291 Testicular hypofunction: Secondary | ICD-10-CM | POA: Diagnosis not present

## 2021-12-15 DIAGNOSIS — N182 Chronic kidney disease, stage 2 (mild): Secondary | ICD-10-CM | POA: Diagnosis not present

## 2021-12-15 DIAGNOSIS — Z23 Encounter for immunization: Secondary | ICD-10-CM | POA: Diagnosis not present

## 2021-12-15 DIAGNOSIS — I129 Hypertensive chronic kidney disease with stage 1 through stage 4 chronic kidney disease, or unspecified chronic kidney disease: Secondary | ICD-10-CM | POA: Diagnosis not present

## 2021-12-20 DIAGNOSIS — L570 Actinic keratosis: Secondary | ICD-10-CM | POA: Diagnosis not present

## 2021-12-20 DIAGNOSIS — C44212 Basal cell carcinoma of skin of right ear and external auricular canal: Secondary | ICD-10-CM | POA: Diagnosis not present

## 2021-12-20 DIAGNOSIS — D485 Neoplasm of uncertain behavior of skin: Secondary | ICD-10-CM | POA: Diagnosis not present

## 2021-12-24 ENCOUNTER — Other Ambulatory Visit: Payer: Self-pay | Admitting: Cardiology

## 2022-01-06 DIAGNOSIS — C44212 Basal cell carcinoma of skin of right ear and external auricular canal: Secondary | ICD-10-CM | POA: Diagnosis not present

## 2022-01-07 ENCOUNTER — Other Ambulatory Visit: Payer: Self-pay | Admitting: Cardiology

## 2022-01-12 ENCOUNTER — Other Ambulatory Visit: Payer: Self-pay | Admitting: Cardiology

## 2022-01-13 DIAGNOSIS — Z48817 Encounter for surgical aftercare following surgery on the skin and subcutaneous tissue: Secondary | ICD-10-CM | POA: Diagnosis not present

## 2022-01-19 ENCOUNTER — Other Ambulatory Visit: Payer: Self-pay | Admitting: Cardiology

## 2022-01-20 DIAGNOSIS — Z48817 Encounter for surgical aftercare following surgery on the skin and subcutaneous tissue: Secondary | ICD-10-CM | POA: Diagnosis not present

## 2022-01-20 NOTE — Telephone Encounter (Signed)
Carvedilol 25 mg # 180 x 3 refills sent to  [id number] [place]

## 2022-02-02 DIAGNOSIS — E1165 Type 2 diabetes mellitus with hyperglycemia: Secondary | ICD-10-CM | POA: Insufficient documentation

## 2022-02-02 DIAGNOSIS — E559 Vitamin D deficiency, unspecified: Secondary | ICD-10-CM

## 2022-02-02 DIAGNOSIS — E1121 Type 2 diabetes mellitus with diabetic nephropathy: Secondary | ICD-10-CM | POA: Insufficient documentation

## 2022-02-02 DIAGNOSIS — K2270 Barrett's esophagus without dysplasia: Secondary | ICD-10-CM | POA: Insufficient documentation

## 2022-02-02 DIAGNOSIS — I129 Hypertensive chronic kidney disease with stage 1 through stage 4 chronic kidney disease, or unspecified chronic kidney disease: Secondary | ICD-10-CM | POA: Insufficient documentation

## 2022-02-02 HISTORY — DX: Vitamin D deficiency, unspecified: E55.9

## 2022-02-03 ENCOUNTER — Other Ambulatory Visit: Payer: Self-pay | Admitting: Cardiology

## 2022-02-03 DIAGNOSIS — Z48817 Encounter for surgical aftercare following surgery on the skin and subcutaneous tissue: Secondary | ICD-10-CM | POA: Diagnosis not present

## 2022-02-03 NOTE — Telephone Encounter (Signed)
Telmisartan 40 mg # 90 x 4 refills [facility] to  [id number] [place]

## 2022-02-04 ENCOUNTER — Encounter: Payer: Self-pay | Admitting: Cardiology

## 2022-02-04 ENCOUNTER — Other Ambulatory Visit: Payer: Self-pay | Admitting: Cardiology

## 2022-02-04 ENCOUNTER — Ambulatory Visit: Payer: Medicare HMO | Admitting: Cardiology

## 2022-02-04 VITALS — BP 154/82 | HR 71 | Ht 70.0 in | Wt 216.4 lb

## 2022-02-04 DIAGNOSIS — I10 Essential (primary) hypertension: Secondary | ICD-10-CM | POA: Diagnosis not present

## 2022-02-04 DIAGNOSIS — E782 Mixed hyperlipidemia: Secondary | ICD-10-CM | POA: Diagnosis not present

## 2022-02-04 DIAGNOSIS — E119 Type 2 diabetes mellitus without complications: Secondary | ICD-10-CM | POA: Diagnosis not present

## 2022-03-17 ENCOUNTER — Other Ambulatory Visit: Payer: Self-pay | Admitting: Cardiology

## 2022-04-05 DIAGNOSIS — Z23 Encounter for immunization: Secondary | ICD-10-CM | POA: Diagnosis not present

## 2022-04-05 DIAGNOSIS — E782 Mixed hyperlipidemia: Secondary | ICD-10-CM | POA: Diagnosis not present

## 2022-04-05 DIAGNOSIS — N182 Chronic kidney disease, stage 2 (mild): Secondary | ICD-10-CM | POA: Diagnosis not present

## 2022-04-05 DIAGNOSIS — E291 Testicular hypofunction: Secondary | ICD-10-CM | POA: Diagnosis not present

## 2022-04-05 DIAGNOSIS — I129 Hypertensive chronic kidney disease with stage 1 through stage 4 chronic kidney disease, or unspecified chronic kidney disease: Secondary | ICD-10-CM | POA: Diagnosis not present

## 2022-04-05 DIAGNOSIS — E1121 Type 2 diabetes mellitus with diabetic nephropathy: Secondary | ICD-10-CM | POA: Diagnosis not present

## 2022-04-12 DIAGNOSIS — I129 Hypertensive chronic kidney disease with stage 1 through stage 4 chronic kidney disease, or unspecified chronic kidney disease: Secondary | ICD-10-CM | POA: Diagnosis not present

## 2022-04-12 DIAGNOSIS — E1165 Type 2 diabetes mellitus with hyperglycemia: Secondary | ICD-10-CM | POA: Diagnosis not present

## 2022-04-12 DIAGNOSIS — E291 Testicular hypofunction: Secondary | ICD-10-CM | POA: Diagnosis not present

## 2022-04-12 DIAGNOSIS — E782 Mixed hyperlipidemia: Secondary | ICD-10-CM | POA: Diagnosis not present

## 2022-07-06 DIAGNOSIS — E782 Mixed hyperlipidemia: Secondary | ICD-10-CM | POA: Diagnosis not present

## 2022-07-06 DIAGNOSIS — E1165 Type 2 diabetes mellitus with hyperglycemia: Secondary | ICD-10-CM | POA: Diagnosis not present

## 2022-07-06 DIAGNOSIS — N182 Chronic kidney disease, stage 2 (mild): Secondary | ICD-10-CM | POA: Diagnosis not present

## 2022-07-14 DIAGNOSIS — N182 Chronic kidney disease, stage 2 (mild): Secondary | ICD-10-CM | POA: Diagnosis not present

## 2022-07-14 DIAGNOSIS — E782 Mixed hyperlipidemia: Secondary | ICD-10-CM | POA: Diagnosis not present

## 2022-07-14 DIAGNOSIS — I129 Hypertensive chronic kidney disease with stage 1 through stage 4 chronic kidney disease, or unspecified chronic kidney disease: Secondary | ICD-10-CM | POA: Diagnosis not present

## 2022-07-14 DIAGNOSIS — E1121 Type 2 diabetes mellitus with diabetic nephropathy: Secondary | ICD-10-CM | POA: Diagnosis not present

## 2022-07-14 DIAGNOSIS — E291 Testicular hypofunction: Secondary | ICD-10-CM | POA: Diagnosis not present

## 2022-09-08 DIAGNOSIS — K5730 Diverticulosis of large intestine without perforation or abscess without bleeding: Secondary | ICD-10-CM | POA: Diagnosis not present

## 2022-09-08 DIAGNOSIS — Z1211 Encounter for screening for malignant neoplasm of colon: Secondary | ICD-10-CM | POA: Diagnosis not present

## 2022-09-08 DIAGNOSIS — D123 Benign neoplasm of transverse colon: Secondary | ICD-10-CM | POA: Diagnosis not present

## 2022-09-08 DIAGNOSIS — K648 Other hemorrhoids: Secondary | ICD-10-CM | POA: Diagnosis not present

## 2022-09-12 DIAGNOSIS — D123 Benign neoplasm of transverse colon: Secondary | ICD-10-CM | POA: Diagnosis not present

## 2022-09-14 ENCOUNTER — Other Ambulatory Visit: Payer: Self-pay | Admitting: Cardiology

## 2022-09-14 NOTE — Telephone Encounter (Signed)
Furosemide 20 mg # 90 x 1 refill sent to [facility]

## 2023-01-04 ENCOUNTER — Other Ambulatory Visit: Payer: Self-pay | Admitting: Cardiology

## 2023-01-05 DIAGNOSIS — E782 Mixed hyperlipidemia: Secondary | ICD-10-CM | POA: Diagnosis not present

## 2023-01-05 DIAGNOSIS — I129 Hypertensive chronic kidney disease with stage 1 through stage 4 chronic kidney disease, or unspecified chronic kidney disease: Secondary | ICD-10-CM | POA: Diagnosis not present

## 2023-01-05 DIAGNOSIS — N182 Chronic kidney disease, stage 2 (mild): Secondary | ICD-10-CM | POA: Diagnosis not present

## 2023-01-05 DIAGNOSIS — Z0000 Encounter for general adult medical examination without abnormal findings: Secondary | ICD-10-CM | POA: Diagnosis not present

## 2023-01-05 DIAGNOSIS — E291 Testicular hypofunction: Secondary | ICD-10-CM | POA: Diagnosis not present

## 2023-01-05 DIAGNOSIS — E1121 Type 2 diabetes mellitus with diabetic nephropathy: Secondary | ICD-10-CM | POA: Diagnosis not present

## 2023-01-05 DIAGNOSIS — R5383 Other fatigue: Secondary | ICD-10-CM | POA: Diagnosis not present

## 2023-01-12 DIAGNOSIS — I129 Hypertensive chronic kidney disease with stage 1 through stage 4 chronic kidney disease, or unspecified chronic kidney disease: Secondary | ICD-10-CM | POA: Diagnosis not present

## 2023-01-12 DIAGNOSIS — N182 Chronic kidney disease, stage 2 (mild): Secondary | ICD-10-CM | POA: Diagnosis not present

## 2023-01-12 DIAGNOSIS — E782 Mixed hyperlipidemia: Secondary | ICD-10-CM | POA: Diagnosis not present

## 2023-01-12 DIAGNOSIS — E291 Testicular hypofunction: Secondary | ICD-10-CM | POA: Diagnosis not present

## 2023-01-12 DIAGNOSIS — Z0000 Encounter for general adult medical examination without abnormal findings: Secondary | ICD-10-CM | POA: Diagnosis not present

## 2023-01-12 DIAGNOSIS — E1121 Type 2 diabetes mellitus with diabetic nephropathy: Secondary | ICD-10-CM | POA: Diagnosis not present

## 2023-01-25 ENCOUNTER — Other Ambulatory Visit: Payer: Self-pay | Admitting: Cardiology

## 2023-01-25 NOTE — Telephone Encounter (Signed)
Rx sent to pharmacy   

## 2023-02-01 ENCOUNTER — Other Ambulatory Visit: Payer: Self-pay | Admitting: Cardiology

## 2023-03-13 DIAGNOSIS — H5203 Hypermetropia, bilateral: Secondary | ICD-10-CM | POA: Diagnosis not present

## 2023-03-16 ENCOUNTER — Other Ambulatory Visit: Payer: Self-pay | Admitting: Cardiology

## 2023-03-31 NOTE — Progress Notes (Unsigned)
Cardiology Office Note:    Date:  04/03/2023   ID:  Aaron Duarte, DOB 1950/12/13, MRN [id number]  PCP:  Georgianne Fick, MD  Cardiologist:  Norman Herrlich, MD    Referring MD: Georgianne Fick, MD    ASSESSMENT:    1. Essential hypertension   2. Mixed hyperlipidemia   3. Type 2 diabetes mellitus without complication, without long-term current use of insulin (HCC)    PLAN:    In order of problems listed above:  Previous resistant hypertension now well treated on a multidrug regimen I think he can decrease his diuretic to 3 days a week and continue both carvedilol and telmisartan continue to monitor trend bring blood pressures to office visits with him Continue his statin with diabetes On good cardioprotective treatment for his diabetes including metformin semaglutide   Next appointment: 1 year   Medication Adjustments/Labs and Tests Ordered: Current medicines are reviewed at length with the patient today.  Concerns regarding medicines are outlined above.  Orders Placed This Encounter  Procedures   EKG 12-Lead   No orders of the defined types were placed in this encounter.    History of Present Illness:    Aaron Duarte is a 72 y.o. male with a hx of resistant hypertension type 2 diabetes hyperlipidemia last seen 02/04/2022. Compliance with diet, lifestyle and medications: Yes  He is taking Ozempic with his diabetes and for weight loss he is lost 20 pounds marked improvement his blood pressure at home validated device good technique runs consistently 120-130/70-80. He has no cardiovascular symptoms of edema lightheadedness shortness of breath chest pain edema palpitation or syncope Laboratory studies 01/05/2023 shows total cholesterol 131 LDL 74 Past Medical History:  Diagnosis Date   Abnormal electrocardiogram (ECG) (EKG) 03/24/2019   Abnormal renal ultrasound 04/05/2019   Bulging lumbar disc    Carotid artery calcification 03/24/2019   Diabetes mellitus without  complication [facility])    ED (erectile dysfunction)    Hyperlipidemia    Hypertension    Hypertensive heart disease 03/24/2019   Hypogonadism male    Umbilical hernia    Vitamin D deficiency 02/02/2022    Current Medications: Current Meds  Medication Sig   amLODipine (NORVASC) 10 MG tablet TAKE 1 TABLET BY MOUTH EVERY DAY   aspirin EC 81 MG tablet Take 81 mg by mouth 3 (three) times a week.   atorvastatin (LIPITOR) 40 MG tablet Take 40 mg by mouth daily.   carvedilol (COREG) 25 MG tablet TAKE 1 TABLET BY MOUTH TWICE A DAY   Echinacea Herb 400 MG CAPS Take 1 capsule by mouth daily.   furosemide (LASIX) 20 MG tablet Take 1 tablet (20 mg total) by mouth daily. Patient must keep appointment on 04/03/23 for further refills. 1 st attempt   Glucosamine Sulfate 1000 MG TABS Take 1 tablet by mouth daily.   ibuprofen (ADVIL) 600 MG tablet Take 600 mg by mouth every 6 (six) hours as needed for moderate pain.   metFORMIN (GLUCOPHAGE-XR) 500 MG 24 hr tablet Take 1,000 mg by mouth 2 (two) times daily.   Misc Natural Products (GINSENG COMPLEX PO) Take 1 tablet by mouth daily.   MODAFINIL PO Take 1 tablet by mouth daily as needed.   Multiple Vitamins-Minerals (CENTRUM SILVER PO) Take 1 tablet by mouth daily.   Semaglutide,0.25 or 0.5MG /DOS, (OZEMPIC, 0.25 OR 0.5 MG/DOSE,) 2 MG/1.5ML SOPN Inject 0.5 mLs into the skin once a week.   sildenafil (REVATIO) 20 MG tablet Take 20 mg by mouth daily as  needed (ED).   telmisartan (MICARDIS) 40 MG tablet TAKE 1 TABLET BY MOUTH EVERY DAY   Vitamin D, Ergocalciferol, (DRISDOL) 1.25 MG (50000 UT) CAPS capsule Take 50,000 Units by mouth once a week.   vitamin E 1000 UNIT capsule Take 1,000 Units by mouth daily.      EKGs/Labs/Other Studies Reviewed:    The following studies were reviewed today:      Physical Exam:    VS:  BP (!) 160/80 (BP Location: Right Arm, Patient Position: Sitting, Cuff Size: Normal)   Pulse 70   Ht 5\' 10"  (1.778 m)   Wt 211 lb 6.4 oz  (95.9 kg)   SpO2 95%   BMI 30.33 kg/m     Wt Readings from Last 3 Encounters:  04/03/23 211 lb 6.4 oz (95.9 kg)  02/04/22 216 lb 6.4 oz (98.2 kg)  02/09/21 213 lb 12.8 oz (97 kg)     GEN:  Well nourished, well developed in no acute distress HEENT: Normal NECK: No JVD; No carotid bruits LYMPHATICS: No lymphadenopathy CARDIAC: RRR, no murmurs, rubs, gallops RESPIRATORY:  Clear to auscultation without rales, wheezing or rhonchi  ABDOMEN: Soft, non-tender, non-distended MUSCULOSKELETAL:  No edema; No deformity  SKIN: Warm and dry NEUROLOGIC:  Alert and oriented x 3 PSYCHIATRIC:  Normal affect    Signed, Norman Herrlich, MD  04/03/2023 10:18 AM    [facility]

## 2023-04-03 ENCOUNTER — Ambulatory Visit: Payer: Medicare HMO | Attending: Cardiology | Admitting: Cardiology

## 2023-04-03 ENCOUNTER — Encounter: Payer: Self-pay | Admitting: Cardiology

## 2023-04-03 VITALS — BP 160/80 | HR 70 | Ht 70.0 in | Wt 211.4 lb

## 2023-04-03 DIAGNOSIS — E782 Mixed hyperlipidemia: Secondary | ICD-10-CM | POA: Diagnosis not present

## 2023-04-03 DIAGNOSIS — I10 Essential (primary) hypertension: Secondary | ICD-10-CM

## 2023-04-03 DIAGNOSIS — E119 Type 2 diabetes mellitus without complications: Secondary | ICD-10-CM

## 2023-04-03 MED ORDER — FUROSEMIDE 20 MG PO TABS: 20.0000 mg | ORAL_TABLET | ORAL | 3 refills | Status: DC

## 2023-04-03 NOTE — Patient Instructions (Signed)
Medication Instructions:  Your physician has recommended you make the following change in your medication:   START: Lasix 20 mg every M - W - F.  *If you need a refill on your cardiac medications before your next appointment, please call your pharmacy*   Lab Work: None If you have labs (blood work) drawn today and your tests are completely normal, you will receive your results only by: MyChart Message (if you have MyChart) OR A paper copy in the mail If you have any lab test that is abnormal or we need to change your treatment, we will call you to review the results.   Testing/Procedures: None   Follow-Up: At [facility], you and your health needs are our priority.  As part of our continuing mission to provide you with exceptional heart care, we have created designated Provider Care Teams.  These Care Teams include your primary Cardiologist (physician) and Advanced Practice Providers (APPs -  Physician Assistants and Nurse Practitioners) who all work together to provide you with the care you need, when you need it.  We recommend signing up for the patient portal called "MyChart".  Sign up information is provided on this After Visit Summary.  MyChart is used to connect with patients for Virtual Visits (Telemedicine).  Patients are able to view lab/test results, encounter notes, upcoming appointments, etc.  Non-urgent messages can be sent to your provider as well.   To learn more about what you can do with MyChart, go to [email or web address].    Your next appointment:   1 year(s)  Provider:   Norman Herrlich, MD    Other Instructions None

## 2023-04-15 ENCOUNTER — Other Ambulatory Visit: Payer: Self-pay | Admitting: Cardiology

## 2023-04-30 ENCOUNTER — Other Ambulatory Visit: Payer: Self-pay | Admitting: Cardiology

## 2023-06-29 DIAGNOSIS — N182 Chronic kidney disease, stage 2 (mild): Secondary | ICD-10-CM | POA: Diagnosis not present

## 2023-06-29 DIAGNOSIS — E1121 Type 2 diabetes mellitus with diabetic nephropathy: Secondary | ICD-10-CM | POA: Diagnosis not present

## 2023-06-29 DIAGNOSIS — I129 Hypertensive chronic kidney disease with stage 1 through stage 4 chronic kidney disease, or unspecified chronic kidney disease: Secondary | ICD-10-CM | POA: Diagnosis not present

## 2023-06-29 DIAGNOSIS — E291 Testicular hypofunction: Secondary | ICD-10-CM | POA: Diagnosis not present

## 2023-06-29 DIAGNOSIS — E782 Mixed hyperlipidemia: Secondary | ICD-10-CM | POA: Diagnosis not present

## 2023-08-17 ENCOUNTER — Other Ambulatory Visit: Payer: Self-pay | Admitting: Cardiology

## 2023-08-22 DIAGNOSIS — G4710 Hypersomnia, unspecified: Secondary | ICD-10-CM | POA: Diagnosis not present

## 2023-08-22 DIAGNOSIS — I129 Hypertensive chronic kidney disease with stage 1 through stage 4 chronic kidney disease, or unspecified chronic kidney disease: Secondary | ICD-10-CM | POA: Diagnosis not present

## 2023-08-22 DIAGNOSIS — E291 Testicular hypofunction: Secondary | ICD-10-CM | POA: Diagnosis not present

## 2023-08-22 DIAGNOSIS — E1121 Type 2 diabetes mellitus with diabetic nephropathy: Secondary | ICD-10-CM | POA: Diagnosis not present

## 2023-08-22 DIAGNOSIS — Z5181 Encounter for therapeutic drug level monitoring: Secondary | ICD-10-CM | POA: Diagnosis not present

## 2023-08-22 DIAGNOSIS — E782 Mixed hyperlipidemia: Secondary | ICD-10-CM | POA: Diagnosis not present

## 2023-08-22 DIAGNOSIS — N182 Chronic kidney disease, stage 2 (mild): Secondary | ICD-10-CM | POA: Diagnosis not present

## 2023-08-30 ENCOUNTER — Other Ambulatory Visit: Payer: Self-pay | Admitting: Cardiology

## 2023-10-25 ENCOUNTER — Other Ambulatory Visit: Payer: Self-pay | Admitting: Cardiology

## 2023-12-10 ENCOUNTER — Other Ambulatory Visit: Payer: Self-pay | Admitting: Cardiology

## 2023-12-11 NOTE — Telephone Encounter (Signed)
 Rx refill sent to pharmacy.

## 2024-01-12 ENCOUNTER — Telehealth: Payer: Self-pay | Admitting: Cardiology

## 2024-01-12 ENCOUNTER — Other Ambulatory Visit: Payer: Self-pay

## 2024-01-12 DIAGNOSIS — R002 Palpitations: Secondary | ICD-10-CM

## 2024-01-12 NOTE — Telephone Encounter (Signed)
 Called the patient and he reported that when he checks his heart rate his O2 sat machine says that he has an irregular heart beat and it has been occurring for the past 3 weeks. His blood pressure has been ranging from 110 - 130's/ 60 - 80's and his heart rate had been in the 80's. Patient reports no other symptoms except being anxious at this new finding. Patient states that he is active and walks every day.

## 2024-01-12 NOTE — Telephone Encounter (Signed)
 Pt would like a call back. Pt didn't want to discuss the issues regarding his heart would like to speak with the nurse.

## 2024-01-12 NOTE — Telephone Encounter (Signed)
 Called the patient and informed him of Dr. Tonja Fray recommendation below:   "Most likely extrasystole, if it bothers him lets put Zio patch for 2 weeks and see what he is doing"   Patient verbalized understanding and had no further questions at this time. A monitor appointment was schedule for 01/16/24 at 2 pm.

## 2024-01-16 ENCOUNTER — Ambulatory Visit: Attending: Cardiology

## 2024-01-16 DIAGNOSIS — R002 Palpitations: Secondary | ICD-10-CM

## 2024-01-21 ENCOUNTER — Other Ambulatory Visit: Payer: Self-pay | Admitting: Cardiology

## 2024-02-02 DIAGNOSIS — R002 Palpitations: Secondary | ICD-10-CM | POA: Diagnosis not present

## 2024-02-09 ENCOUNTER — Other Ambulatory Visit: Payer: Self-pay | Admitting: Cardiology

## 2024-02-09 NOTE — Telephone Encounter (Signed)
 Rx refill sent to pharmacy.

## 2024-02-12 NOTE — Progress Notes (Signed)
 " Cardiology Office Note   Date:  02/14/2024  ID:  [name], DOB [birth year], MRN [id number] PCP: Verdia Lombard, MD  [facility] Providers Cardiologist:  Redell Leiter, MD     History of Present Illness Aaron Duarte is a 73 y.o. male with a past medical history of hypertension, carotid artery stenosis, DM2, Barrett's esophagus, CKD, dyslipidemia, frequent VE's.  02/02/2024 monitor average heart rate 81 bpm, predominant rhythm was sinus, 1 run of VT lasting 5 beats, 5 runs of SVT second-degree type I was present, VE's were frequent at 12.6% 04/02/2019 echo EF 60 to 65%, impaired relaxation, mild thickening of the aortic valve 04/02/2019 carotid duplex mild bilateral carotid artery stenosis 03/2019 renal artery ultrasound and CT of abdomen negative for renal artery stenosis  He established with Dr. Leiter in 2020 for evaluation of dyslipidemia and hypertension. He underwent a renal duplex that was hard to visualize >> CT of abdomen was completed revealing no renal artery stenosis.  He was evaluated by Dr. Leiter on 04/03/2023, his blood pressure was well-controlled, no further changes made to his plan of care is advised he can follow-up in 1 year.  In May 2025 he called our triage line reporting that when he checked his pulse ox and his machine was advising him he had an irregular heartbeat, a monitor was arranged revealing 1 run of VT, 5 runs of SVT, VE's frequent at 12.6%.  He presents today for follow-up of his recent heart monitor.  He is completely asymptomatic, we did discuss his episode of VT as long as his frequent episodes of VE.  He works out most days of the week, walking primarily.    ROS: Review of Systems  All other systems reviewed and are negative.    Studies Reviewed      Cardiac Studies & Procedures   ______________________________________________________________________________________________     ECHOCARDIOGRAM  ECHOCARDIOGRAM COMPLETE  04/02/2019  Narrative ECHOCARDIOGRAM REPORT    Patient Name:   Aaron Duarte Date of Exam: 04/02/2019 Medical Rec #:  [id number]  Height:       69.0 in Accession #:    [id number] Weight:       221.0 lb Date of Birth:  [birth year]  BSA:          2.16 m Patient Age:    68 years   BP:           198/102 mmHg Patient Gender: M          HR:           65 bpm. Exam Location:  [place]   Procedure: 2D Echo  Indications:    Uncontralled HTN  History:        Patient has no prior history of Echocardiogram examinations. Abnormal ECG Risk Factors: Hypertension, Diabetes and Dyslipidemia.  Sonographer:    Fairy Canton RDCS (AE) Referring Phys: [id number] REDELL JINNY LEITER   Sonographer Comments: Technically challenging study due to limited acoustic windows and Technically difficult study due to poor echo windows. IMPRESSIONS   1. The left ventricle has normal systolic function with an ejection fraction of 60-65%. The cavity size was normal. Left ventricular diastolic Doppler parameters are consistent with impaired relaxation. 2. The right ventricle has normal systolic function. The cavity was normal. There is no increase in right ventricular Fagerstrom thickness. 3. Mild thickening of the aortic valve. Aortic valve regurgitation was not assessed by color flow Doppler. 4. The aorta is normal unless otherwise noted.  FINDINGS Left Ventricle: The  left ventricle has normal systolic function, with an ejection fraction of 60-65%. The cavity size was normal. There is no increase in left ventricular Fusaro thickness. Left ventricular diastolic Doppler parameters are consistent with impaired relaxation.  Right Ventricle: The right ventricle has normal systolic function. The cavity was normal. There is no increase in right ventricular Lantzy thickness.  Left Atrium: Left atrial size was normal in size.  Right Atrium: Right atrial size was normal in size. Right atrial pressure is estimated at 3 mmHg.  Interatrial  Septum: No atrial level shunt detected by color flow Doppler.  Pericardium: There is no evidence of pericardial effusion.  Mitral Valve: The mitral valve is normal in structure. Mitral valve regurgitation is trivial by color flow Doppler.  Tricuspid Valve: The tricuspid valve is normal in structure. Tricuspid valve regurgitation was not visualized by color flow Doppler.  Aortic Valve: The aortic valve is normal in structure. Mild thickening of the aortic valve. Aortic valve regurgitation was not assessed by color flow Doppler.  Pulmonic Valve: The pulmonic valve was normal in structure. Pulmonic valve regurgitation was not assessed by color flow Doppler.  Aorta: The aorta is normal unless otherwise noted.  Venous: The inferior vena cava measures 1.17 cm, is normal in size with greater than 50% respiratory variability.   +--------------+--------++ LEFT VENTRICLE         +----------------+----------++ +--------------+--------++ Diastology                 PLAX 2D                +----------------+----------++ +--------------+--------++ LV e' lateral:  13.80 cm/s LVIDd:        5.40 cm  +----------------+----------++ +--------------+--------++ LV E/e' lateral:6.7        LVIDs:        3.08 cm  +----------------+----------++ +--------------+--------++ LV e' medial:   7.40 cm/s  LV PW:        1.11 cm  +----------------+----------++ +--------------+--------++ LV E/e' medial: 12.6       LV IVS:       0.98 cm  +----------------+----------++ +--------------+--------++ LVOT diam:    2.20 cm  +--------------+--------++ LV SV:        104 ml   +--------------+--------++ LV SV Index:  46.39    +--------------+--------++ LVOT Area:    3.80 cm +--------------+--------++                        +--------------+--------++  +---------------+----------++ RIGHT VENTRICLE           +---------------+----------++ RV S prime:     12.50 cm/s +---------------+----------++ TAPSE (M-mode):2.9 cm     +---------------+----------++  +---------------+-------++-----------++ LEFT ATRIUM           Index       +---------------+-------++-----------++ LA diam:       4.60 cm2.13 cm/m  +---------------+-------++-----------++ LA Vol (A2C):  59.1 ml27.42 ml/m +---------------+-------++-----------++ LA Vol (A4C):  48.4 ml22.45 ml/m +---------------+-------++-----------++ LA Biplane Vol:54.2 ml25.14 ml/m +---------------+-------++-----------++ +------------+---------++-----------++ RIGHT ATRIUM         Index       +------------+---------++-----------++ RA Area:    15.00 cm            +------------+---------++-----------++ RA Volume:  38.20 ml 17.72 ml/m +------------+---------++-----------++ +------------+-----------++ AORTIC VALVE            +------------+-----------++ LVOT Vmax:  83.30 cm/s  +------------+-----------++ LVOT Vmean: 52.800 cm/s +------------+-----------++ LVOT VTI:   0.201 m     +------------+-----------++  +-------------+-------++  AORTA                +-------------+-------++ Ao Root diam:3.20 cm +-------------+-------++ Ao Asc diam: 3.60 cm +-------------+-------++  +--------------+----------++ MITRAL VALVE              +--------------+-------+ +--------------+----------++  SHUNTS                MV Area (PHT):3.27 cm    +--------------+-------+ +--------------+----------++  Systemic VTI: 0.20 m  MV PHT:       67.28 msec  +--------------+-------+ +--------------+----------++  Systemic Diam:2.20 cm MV Decel Time:232 msec    +--------------+-------+ +--------------+----------++ +--------------+-----------++ MV E velocity:93.00 cm/s  +--------------+-----------++ MV A velocity:103.00 cm/s +--------------+-----------++ MV E/A ratio: 0.90         +--------------+-----------++  +---------+-------+ IVC              +---------+-------+ IVC diam:1.17 cm +---------+-------+   Lamar Fitch MD Electronically signed by Lamar Fitch MD Signature Date/Time: 04/02/2019/1:06:17 PM    Final          ______________________________________________________________________________________________      Risk Assessment/Calculations      Physical Exam VS:  BP (!) 146/80 (BP Location: Right Arm, Patient Position: Sitting)   Pulse 69   Ht 5' 10 (1.778 m)   Wt 204 lb 9.6 oz (92.8 kg)   SpO2 94%   BMI 29.36 kg/m        Wt Readings from Last 3 Encounters:  02/14/24 204 lb 9.6 oz (92.8 kg)  04/03/23 211 lb 6.4 oz (95.9 kg)  02/04/22 216 lb 6.4 oz (98.2 kg)    GEN: Well nourished, well developed in no acute distress NECK: No JVD; No carotid bruits CARDIAC: RRR, no murmurs, rubs, gallops RESPIRATORY:  Clear to auscultation without rales, wheezing or rhonchi  ABDOMEN: Soft, non-tender, non-distended EXTREMITIES:  No edema; No deformity   ASSESSMENT AND PLAN Hypertension - his BP is elevated at 146/80, continue Norvasc  10 mg daily, Coreg  25 mg twice daily.   SVT/NS VT/Frequent ventricular ectopy-will arrange for an ischemic evaluation as well as an echocardiogram.  Carotid artery stenosis - mild bilateral carotid artery stenosis per duplex in 2020, will discuss repeat imaging at next OV.     Informed Consent   Shared Decision Making/Informed Consent The risks [chest pain, shortness of breath, cardiac arrhythmias, dizziness, blood pressure fluctuations, myocardial infarction, stroke/transient ischemic attack, nausea, vomiting, allergic reaction, radiation exposure, metallic taste sensation and life-threatening complications (estimated to be 1 in 10,000)], benefits (risk stratification, diagnosing coronary artery disease, treatment guidance) and alternatives of a nuclear stress test were discussed in detail with  Mr. Lasky and he agrees to proceed.     Dispo: Echo, lexiscan, CBC, TSH, magnesium, CMET. Follow up in 1 year, sooner if needed.   Signed, Delon JAYSON Hoover, NP  "

## 2024-02-13 DIAGNOSIS — G4710 Hypersomnia, unspecified: Secondary | ICD-10-CM | POA: Insufficient documentation

## 2024-02-14 ENCOUNTER — Encounter: Payer: Self-pay | Admitting: Cardiology

## 2024-02-14 ENCOUNTER — Telehealth: Payer: Self-pay | Admitting: Cardiology

## 2024-02-14 ENCOUNTER — Ambulatory Visit: Attending: Cardiology | Admitting: Cardiology

## 2024-02-14 VITALS — BP 146/80 | HR 69 | Ht 70.0 in | Wt 204.6 lb

## 2024-02-14 DIAGNOSIS — I493 Ventricular premature depolarization: Secondary | ICD-10-CM | POA: Diagnosis not present

## 2024-02-14 DIAGNOSIS — I10 Essential (primary) hypertension: Secondary | ICD-10-CM | POA: Diagnosis not present

## 2024-02-14 DIAGNOSIS — R002 Palpitations: Secondary | ICD-10-CM

## 2024-02-14 DIAGNOSIS — E119 Type 2 diabetes mellitus without complications: Secondary | ICD-10-CM | POA: Diagnosis not present

## 2024-02-14 DIAGNOSIS — E785 Hyperlipidemia, unspecified: Secondary | ICD-10-CM

## 2024-02-14 DIAGNOSIS — I4729 Other ventricular tachycardia: Secondary | ICD-10-CM

## 2024-02-14 DIAGNOSIS — I129 Hypertensive chronic kidney disease with stage 1 through stage 4 chronic kidney disease, or unspecified chronic kidney disease: Secondary | ICD-10-CM

## 2024-02-14 DIAGNOSIS — I119 Hypertensive heart disease without heart failure: Secondary | ICD-10-CM | POA: Diagnosis not present

## 2024-02-14 DIAGNOSIS — I6529 Occlusion and stenosis of unspecified carotid artery: Secondary | ICD-10-CM | POA: Diagnosis not present

## 2024-02-14 NOTE — Telephone Encounter (Signed)
 Please let him know I discussed with Dr. Jae partner and he suggested a repeat echo as well.  I have placed the order for that, he will just need it scheduled.

## 2024-02-14 NOTE — Telephone Encounter (Signed)
 Left vm to return call.

## 2024-02-14 NOTE — Patient Instructions (Signed)
 Medication Instructions:  Your physician recommends that you continue on your current medications as directed. Please refer to the Current Medication list given to you today.  *If you need a refill on your cardiac medications before your next appointment, please call your pharmacy*   Lab Work: CBC, CMP, TSH, A1C, TSH, MGM, - today If you have labs (blood work) drawn today and your tests are completely normal, you will receive your results only by: MyChart Message (if you have MyChart) OR A paper copy in the mail If you have any lab test that is abnormal or we need to change your treatment, we will call you to review the results.   Testing/Procedures: Your physician has requested that you have a lexiscan myoview. For further information please visit [email or web address] Please follow instruction sheet, as given.  The test will take approximately 3 to 4 hours to complete; you may bring reading material.  If someone comes with you to your appointment, they will need to remain in the main lobby due to limited space in the testing area.   How to prepare for your Myocardial Perfusion Test: Do not eat or drink 3 hours prior to your test, except you may have water. Do not consume products containing caffeine (regular or decaffeinated) 12 hours prior to your test. (ex: coffee, chocolate, sodas, tea). Do bring a list of your current medications with you.  If not listed below, you may take your medications as normal. Do wear comfortable clothes (no dresses or overalls) and walking shoes, tennis shoes preferred (No heels or open toe shoes are allowed). Do NOT wear cologne, perfume, aftershave, or lotions (deodorant is allowed). If these instructions are not followed, your test will have to be rescheduled.     Follow-Up: At [facility], you and your health needs are our priority.  As part of our continuing mission to provide you with exceptional heart care, we have created designated Provider Care  Teams.  These Care Teams include your primary Cardiologist (physician) and Advanced Practice Providers (APPs -  Physician Assistants and Nurse Practitioners) who all work together to provide you with the care you need, when you need it.  We recommend signing up for the patient portal called MyChart.  Sign up information is provided on this After Visit Summary.  MyChart is used to connect with patients for Virtual Visits (Telemedicine).  Patients are able to view lab/test results, encounter notes, upcoming appointments, etc.  Non-urgent messages can be sent to your provider as well.   To learn more about what you can do with MyChart, go to [email or web address].    Your next appointment:   12 month(s) with Dr. Monetta  The format for your next appointment:   In Person  Provider:   Delon Hoover, NP   Other Instructions NA

## 2024-02-15 ENCOUNTER — Ambulatory Visit: Payer: Self-pay | Admitting: Cardiology

## 2024-02-15 ENCOUNTER — Telehealth (HOSPITAL_COMMUNITY): Payer: Self-pay | Admitting: *Deleted

## 2024-02-15 DIAGNOSIS — I10 Essential (primary) hypertension: Secondary | ICD-10-CM

## 2024-02-15 DIAGNOSIS — I4729 Other ventricular tachycardia: Secondary | ICD-10-CM

## 2024-02-15 LAB — COMPREHENSIVE METABOLIC PANEL WITH GFR
ALT: 20 IU/L (ref 0–44)
AST: 18 IU/L (ref 0–40)
Albumin: 4.5 g/dL (ref 3.8–4.8)
Alkaline Phosphatase: 55 IU/L (ref 44–121)
BUN/Creatinine Ratio: 16 (ref 10–24)
BUN: 14 mg/dL (ref 8–27)
Bilirubin Total: 0.4 mg/dL (ref 0.0–1.2)
CO2: 20 mmol/L (ref 20–29)
Calcium: 9.4 mg/dL (ref 8.6–10.2)
Chloride: 102 mmol/L (ref 96–106)
Creatinine, Ser: 0.9 mg/dL (ref 0.76–1.27)
Globulin, Total: 2.2 g/dL (ref 1.5–4.5)
Glucose: 139 mg/dL — ABNORMAL HIGH (ref 70–99)
Potassium: 4.3 mmol/L (ref 3.5–5.2)
Sodium: 140 mmol/L (ref 134–144)
Total Protein: 6.7 g/dL (ref 6.0–8.5)
eGFR: 90 mL/min/1.73

## 2024-02-15 LAB — CBC
Hematocrit: 41.3 % (ref 37.5–51.0)
Hemoglobin: 13.4 g/dL (ref 13.0–17.7)
MCH: 29.6 pg (ref 26.6–33.0)
MCHC: 32.4 g/dL (ref 31.5–35.7)
MCV: 91 fL (ref 79–97)
Platelets: 265 x10E3/uL (ref 150–450)
RBC: 4.53 x10E6/uL (ref 4.14–5.80)
RDW: 12.8 % (ref 11.6–15.4)
WBC: 8.3 x10E3/uL (ref 3.4–10.8)

## 2024-02-15 LAB — MAGNESIUM: Magnesium: 1.3 mg/dL — ABNORMAL LOW (ref 1.6–2.3)

## 2024-02-15 LAB — HEMOGLOBIN A1C
Est. average glucose Bld gHb Est-mCnc: 154 mg/dL
Hgb A1c MFr Bld: 7 % — ABNORMAL HIGH (ref 4.8–5.6)

## 2024-02-15 LAB — TSH: TSH: 1.44 u[IU]/mL (ref 0.450–4.500)

## 2024-02-15 NOTE — Telephone Encounter (Signed)
 Left a detailed message regarding his Stress Test on 02/20/24 at 12:45 also a quick reminder not to take his Carvedilol  for 24 hours before the Test.

## 2024-02-15 NOTE — Telephone Encounter (Signed)
Recommendations reviewed with pt as per [facility] note.  Pt verbalized understanding and had no additional questions.

## 2024-02-19 NOTE — Telephone Encounter (Signed)
-----   Message from Delon JAYSON Hoover sent at 02/15/2024  3:47 PM EDT ----- Normal electrolytes and kidney function.  A1C controlled at 7%.  Magnesium is too low, recommend Slow-Mag 2 tablets daily and repeat 2 weeks BMP along with magnesium, can get at [facility] or pharmacy.   CBC normal.  Thyroid  normal.   I already forwarded results to PCP.    ----- Message ----- From: Interface, [facility] Lab Results In Sent: 02/15/2024   5:37 AM EDT To: Delon JAYSON Hoover, NP

## 2024-02-19 NOTE — Telephone Encounter (Signed)
 MyChart message

## 2024-02-20 ENCOUNTER — Other Ambulatory Visit: Payer: Self-pay

## 2024-02-20 ENCOUNTER — Telehealth: Payer: Self-pay | Admitting: *Deleted

## 2024-02-20 ENCOUNTER — Ambulatory Visit: Attending: Cardiology

## 2024-02-20 DIAGNOSIS — R002 Palpitations: Secondary | ICD-10-CM

## 2024-02-20 LAB — MYOCARDIAL PERFUSION IMAGING
Angina Index: 0
Duke Treadmill Score: -1
Estimated workload: 10.1
Exercise duration (min): 9 min
Exercise duration (sec): 0 s
LV dias vol: 104 mL (ref 62–150)
LV sys vol: 39 mL
MPHR: 147 {beats}/min
Nuc Stress EF: 62 %
Peak HR: 146 {beats}/min
Percent HR: 99 %
Rest HR: 69 {beats}/min
Rest Nuclear Isotope Dose: 10.6 mCi
SDS: 1
SRS: 3
SSS: 4
ST Depression (mm): 2 mm
Stress Nuclear Isotope Dose: 28.3 mCi
TID: 0.88

## 2024-02-20 MED ORDER — TECHNETIUM TC 99M TETROFOSMIN IV KIT
10.6000 | PACK | Freq: Once | INTRAVENOUS | Status: AC | PRN
  Administered 2024-02-20: 10.6 via INTRAVENOUS

## 2024-02-20 MED ORDER — SLOW-MAG 71.5-119 MG PO TBEC: 2.0000 | DELAYED_RELEASE_TABLET | Freq: Every day | ORAL | 3 refills | Status: DC

## 2024-02-20 MED ORDER — TECHNETIUM TC 99M TETROFOSMIN IV KIT
28.3000 | PACK | Freq: Once | INTRAVENOUS | Status: AC | PRN
  Administered 2024-02-20: 28.3 via INTRAVENOUS

## 2024-02-20 NOTE — Progress Notes (Signed)
 Left message for the patient to call back.

## 2024-02-20 NOTE — Progress Notes (Signed)
 Patient informed of results.Slow-Mag medication ordered via Epic and sent to the patient's pharmacy. Lab work ordered via [employer]

## 2024-02-28 DIAGNOSIS — R002 Palpitations: Secondary | ICD-10-CM | POA: Diagnosis not present

## 2024-03-01 ENCOUNTER — Ambulatory Visit: Payer: Self-pay | Admitting: Cardiology

## 2024-03-06 ENCOUNTER — Ambulatory Visit: Attending: Cardiology

## 2024-03-06 DIAGNOSIS — I517 Cardiomegaly: Secondary | ICD-10-CM | POA: Diagnosis not present

## 2024-03-06 DIAGNOSIS — I4729 Other ventricular tachycardia: Secondary | ICD-10-CM | POA: Diagnosis not present

## 2024-03-06 DIAGNOSIS — I493 Ventricular premature depolarization: Secondary | ICD-10-CM

## 2024-03-06 LAB — ECHOCARDIOGRAM COMPLETE
Area-P 1/2: 3.44 cm2
MV M vel: 4.64 m/s
MV Peak grad: 86.1 mmHg
Radius: 0.3 cm
S' Lateral: 3.8 cm

## 2024-03-07 ENCOUNTER — Telehealth: Payer: Self-pay

## 2024-03-07 ENCOUNTER — Ambulatory Visit: Payer: Self-pay | Admitting: Cardiology

## 2024-03-07 DIAGNOSIS — R002 Palpitations: Secondary | ICD-10-CM

## 2024-03-07 LAB — BASIC METABOLIC PANEL WITH GFR
BUN/Creatinine Ratio: 19 (ref 10–24)
BUN: 17 mg/dL (ref 8–27)
CO2: 21 mmol/L (ref 20–29)
Calcium: 9.2 mg/dL (ref 8.6–10.2)
Chloride: 101 mmol/L (ref 96–106)
Creatinine, Ser: 0.91 mg/dL (ref 0.76–1.27)
Glucose: 130 mg/dL — ABNORMAL HIGH (ref 70–99)
Potassium: 4.5 mmol/L (ref 3.5–5.2)
Sodium: 139 mmol/L (ref 134–144)
eGFR: 89 mL/min/1.73

## 2024-03-07 LAB — MAGNESIUM: Magnesium: 1.4 mg/dL — ABNORMAL LOW (ref 1.6–2.3)

## 2024-03-07 NOTE — Telephone Encounter (Signed)
 Results reviewed with pt as per Aaron Hoover, NP's note.  Pt verbalized understanding and had no additional questions.Referral made to EP. Routed to PCP

## 2024-03-07 NOTE — Telephone Encounter (Signed)
 Pt returning call

## 2024-03-08 ENCOUNTER — Other Ambulatory Visit: Payer: Self-pay

## 2024-03-08 NOTE — Progress Notes (Signed)
 Patient informed of the results of his lab work. Patient also stated that he was going to the pharmacy now to purchase the magnesium glycinate recommended by Delon Hoover, NP.  Labs ordered via Epic.

## 2024-03-08 NOTE — Telephone Encounter (Signed)
 Patient is returning call.

## 2024-03-08 NOTE — Progress Notes (Signed)
 Left message for the patient to call back. Labs ordered via Epic.

## 2024-03-18 DIAGNOSIS — I129 Hypertensive chronic kidney disease with stage 1 through stage 4 chronic kidney disease, or unspecified chronic kidney disease: Secondary | ICD-10-CM | POA: Diagnosis not present

## 2024-03-18 DIAGNOSIS — E1121 Type 2 diabetes mellitus with diabetic nephropathy: Secondary | ICD-10-CM | POA: Diagnosis not present

## 2024-03-18 DIAGNOSIS — E291 Testicular hypofunction: Secondary | ICD-10-CM | POA: Diagnosis not present

## 2024-03-18 DIAGNOSIS — E782 Mixed hyperlipidemia: Secondary | ICD-10-CM | POA: Diagnosis not present

## 2024-03-18 DIAGNOSIS — N182 Chronic kidney disease, stage 2 (mild): Secondary | ICD-10-CM | POA: Diagnosis not present

## 2024-03-25 DIAGNOSIS — I471 Supraventricular tachycardia, unspecified: Secondary | ICD-10-CM | POA: Diagnosis not present

## 2024-03-25 DIAGNOSIS — G4710 Hypersomnia, unspecified: Secondary | ICD-10-CM | POA: Diagnosis not present

## 2024-03-25 DIAGNOSIS — Z5181 Encounter for therapeutic drug level monitoring: Secondary | ICD-10-CM | POA: Diagnosis not present

## 2024-03-25 DIAGNOSIS — I129 Hypertensive chronic kidney disease with stage 1 through stage 4 chronic kidney disease, or unspecified chronic kidney disease: Secondary | ICD-10-CM | POA: Diagnosis not present

## 2024-03-25 DIAGNOSIS — E782 Mixed hyperlipidemia: Secondary | ICD-10-CM | POA: Diagnosis not present

## 2024-03-25 DIAGNOSIS — E7841 Elevated Lipoprotein(a): Secondary | ICD-10-CM | POA: Diagnosis not present

## 2024-03-25 DIAGNOSIS — E291 Testicular hypofunction: Secondary | ICD-10-CM | POA: Diagnosis not present

## 2024-03-25 DIAGNOSIS — E1121 Type 2 diabetes mellitus with diabetic nephropathy: Secondary | ICD-10-CM | POA: Diagnosis not present

## 2024-03-25 DIAGNOSIS — Z0000 Encounter for general adult medical examination without abnormal findings: Secondary | ICD-10-CM | POA: Diagnosis not present

## 2024-03-25 DIAGNOSIS — N182 Chronic kidney disease, stage 2 (mild): Secondary | ICD-10-CM | POA: Diagnosis not present

## 2024-04-08 NOTE — Telephone Encounter (Signed)
 Error

## 2024-04-09 LAB — BASIC METABOLIC PANEL WITH GFR
BUN/Creatinine Ratio: 17 (ref 10–24)
BUN: 19 mg/dL (ref 8–27)
CO2: 22 mmol/L (ref 20–29)
Calcium: 9.2 mg/dL (ref 8.6–10.2)
Chloride: 100 mmol/L (ref 96–106)
Creatinine, Ser: 1.12 mg/dL (ref 0.76–1.27)
Glucose: 115 mg/dL — ABNORMAL HIGH (ref 70–99)
Potassium: 4.7 mmol/L (ref 3.5–5.2)
Sodium: 138 mmol/L (ref 134–144)
eGFR: 69 mL/min/1.73

## 2024-04-09 LAB — MAGNESIUM: Magnesium: 1.8 mg/dL (ref 1.6–2.3)

## 2024-04-10 ENCOUNTER — Ambulatory Visit: Payer: Self-pay | Admitting: Cardiology

## 2024-04-20 ENCOUNTER — Other Ambulatory Visit: Payer: Self-pay | Admitting: Cardiology

## 2024-04-20 NOTE — Progress Notes (Unsigned)
  Electrophysiology Office Note:   Date:  04/20/2024  ID:  [name], DOB 05/13/1951, MRN [id number]  Primary Cardiologist: Redell Leiter, MD Primary Heart Failure: None Electrophysiologist: None  {Click to update primary MD,subspecialty MD or APP then REFRESH:1}    History of Present Illness:   Aaron Duarte is a 73 y.o. male with h/o hypertension, carotid artery stenosis, type 2 diabetes, Barrett's esophagus, CKD, hyperlipidemia, PVCs seen today for  for Electrophysiology evaluation of PVCs at the request of Redell Leiter.    He called cardiology clinic May 2025 reporting irregular heartbeats.  He had a cardiac monitor that showed a 12.6% PVC burden.  Review of systems complete and found to be negative unless listed in HPI.   EP Information / Studies Reviewed:    {EKGtoday:28818}        Risk Assessment/Calculations:            Physical Exam:   VS:  There were no vitals taken for this visit.   Wt Readings from Last 3 Encounters:  02/20/24 204 lb (92.5 kg)  02/14/24 204 lb 9.6 oz (92.8 kg)  04/03/23 211 lb 6.4 oz (95.9 kg)     GEN: Well nourished, well developed in no acute distress NECK: No JVD; No carotid bruits CARDIAC: {EPRHYTHM:28826}, no murmurs, rubs, gallops RESPIRATORY:  Clear to auscultation without rales, wheezing or rhonchi  ABDOMEN: Soft, non-tender, non-distended EXTREMITIES:  No edema; No deformity   ASSESSMENT AND PLAN:    1.  PVCs: 12.6% burden on cardiac monitor-personally reviewed.  No ischemia and normal ejection fraction noted on prior evaluation.***  2.  Hypertension:***  Follow up with {EPMDS:28135::EP Team} {EPFOLLOW LE:71826}  Signed, Dejia Ebron Gladis Norton, MD

## 2024-04-22 ENCOUNTER — Ambulatory Visit: Attending: Cardiology | Admitting: Cardiology

## 2024-04-22 ENCOUNTER — Encounter: Payer: Self-pay | Admitting: Cardiology

## 2024-04-22 VITALS — BP 150/80 | HR 71 | Ht 70.0 in | Wt 209.8 lb

## 2024-04-22 DIAGNOSIS — I493 Ventricular premature depolarization: Secondary | ICD-10-CM

## 2024-04-22 DIAGNOSIS — I10 Essential (primary) hypertension: Secondary | ICD-10-CM | POA: Diagnosis not present

## 2024-04-22 DIAGNOSIS — R002 Palpitations: Secondary | ICD-10-CM

## 2024-05-08 ENCOUNTER — Other Ambulatory Visit: Payer: Self-pay | Admitting: Cardiology

## 2024-06-12 ENCOUNTER — Other Ambulatory Visit: Payer: Self-pay | Admitting: Cardiology
